# Patient Record
Sex: Female | Born: 1950 | Race: White | Hispanic: No | Smoking: Never smoker
Health system: Southern US, Community
[De-identification: ages and names within clinical notes are randomized; demographics above are authoritative.]

## PROBLEM LIST (undated history)

## (undated) DIAGNOSIS — D72819 Decreased white blood cell count, unspecified: Secondary | ICD-10-CM

## (undated) DIAGNOSIS — F411 Generalized anxiety disorder: Secondary | ICD-10-CM

## (undated) DIAGNOSIS — F429 Obsessive-compulsive disorder, unspecified: Secondary | ICD-10-CM

## (undated) DIAGNOSIS — D649 Anemia, unspecified: Secondary | ICD-10-CM

## (undated) DIAGNOSIS — F419 Anxiety disorder, unspecified: Secondary | ICD-10-CM

## (undated) DIAGNOSIS — R011 Cardiac murmur, unspecified: Secondary | ICD-10-CM

## (undated) DIAGNOSIS — F32A Depression, unspecified: Secondary | ICD-10-CM

## (undated) DIAGNOSIS — F329 Major depressive disorder, single episode, unspecified: Secondary | ICD-10-CM

## (undated) DIAGNOSIS — I10 Essential (primary) hypertension: Secondary | ICD-10-CM

## (undated) DIAGNOSIS — Z9989 Dependence on other enabling machines and devices: Secondary | ICD-10-CM

## (undated) DIAGNOSIS — K219 Gastro-esophageal reflux disease without esophagitis: Secondary | ICD-10-CM

## (undated) DIAGNOSIS — E785 Hyperlipidemia, unspecified: Secondary | ICD-10-CM

## (undated) DIAGNOSIS — G4733 Obstructive sleep apnea (adult) (pediatric): Secondary | ICD-10-CM

## (undated) DIAGNOSIS — F320 Major depressive disorder, single episode, mild: Secondary | ICD-10-CM

## (undated) HISTORY — DX: Decreased white blood cell count, unspecified: D72.819

## (undated) HISTORY — DX: Dependence on other enabling machines and devices: Z99.89

## (undated) HISTORY — PX: COLONOSCOPY: SHX174

## (undated) HISTORY — DX: Major depressive disorder, single episode, mild: F32.0

## (undated) HISTORY — DX: Major depressive disorder, single episode, unspecified: F32.9

## (undated) HISTORY — PX: BREAST CYST EXCISION: SHX579

## (undated) HISTORY — DX: Depression, unspecified: F32.A

## (undated) HISTORY — DX: Cardiac murmur, unspecified: R01.1

## (undated) HISTORY — DX: Obsessive-compulsive disorder, unspecified: F42.9

## (undated) HISTORY — PX: BREAST MASS EXCISION: SHX1267

## (undated) HISTORY — DX: Anxiety disorder, unspecified: F41.9

## (undated) HISTORY — DX: Gastro-esophageal reflux disease without esophagitis: K21.9

## (undated) HISTORY — DX: Obstructive sleep apnea (adult) (pediatric): G47.33

## (undated) HISTORY — DX: Generalized anxiety disorder: F41.1

## (undated) HISTORY — DX: Essential (primary) hypertension: I10

## (undated) HISTORY — DX: Anemia, unspecified: D64.9

## (undated) HISTORY — PX: BREAST SURGERY: SHX581

## (undated) HISTORY — DX: Hyperlipidemia, unspecified: E78.5

---

## 1999-07-04 ENCOUNTER — Other Ambulatory Visit: Admission: RE | Admit: 1999-07-04 | Discharge: 1999-07-04 | Payer: Self-pay | Admitting: Obstetrics and Gynecology

## 1999-07-31 ENCOUNTER — Encounter: Payer: Self-pay | Admitting: Obstetrics and Gynecology

## 1999-07-31 ENCOUNTER — Ambulatory Visit (HOSPITAL_COMMUNITY): Admission: RE | Admit: 1999-07-31 | Discharge: 1999-07-31 | Payer: Self-pay | Admitting: Obstetrics and Gynecology

## 1999-09-19 ENCOUNTER — Other Ambulatory Visit: Admission: RE | Admit: 1999-09-19 | Discharge: 1999-09-19 | Payer: Self-pay | Admitting: Obstetrics and Gynecology

## 1999-09-19 ENCOUNTER — Encounter (INDEPENDENT_AMBULATORY_CARE_PROVIDER_SITE_OTHER): Payer: Self-pay

## 1999-11-11 ENCOUNTER — Encounter (INDEPENDENT_AMBULATORY_CARE_PROVIDER_SITE_OTHER): Payer: Self-pay | Admitting: Specialist

## 1999-11-11 ENCOUNTER — Other Ambulatory Visit: Admission: RE | Admit: 1999-11-11 | Discharge: 1999-11-11 | Payer: Self-pay | Admitting: Obstetrics and Gynecology

## 2000-03-26 ENCOUNTER — Other Ambulatory Visit: Admission: RE | Admit: 2000-03-26 | Discharge: 2000-03-26 | Payer: Self-pay | Admitting: Obstetrics and Gynecology

## 2000-04-03 ENCOUNTER — Encounter: Admission: RE | Admit: 2000-04-03 | Discharge: 2000-04-03 | Payer: Self-pay | Admitting: Obstetrics and Gynecology

## 2000-04-03 ENCOUNTER — Encounter: Payer: Self-pay | Admitting: Obstetrics and Gynecology

## 2000-05-11 ENCOUNTER — Encounter (INDEPENDENT_AMBULATORY_CARE_PROVIDER_SITE_OTHER): Payer: Self-pay

## 2000-05-11 ENCOUNTER — Other Ambulatory Visit: Admission: RE | Admit: 2000-05-11 | Discharge: 2000-05-11 | Payer: Self-pay | Admitting: Obstetrics and Gynecology

## 2000-08-26 ENCOUNTER — Encounter: Admission: RE | Admit: 2000-08-26 | Discharge: 2000-08-26 | Payer: Self-pay | Admitting: General Surgery

## 2000-08-26 ENCOUNTER — Encounter: Payer: Self-pay | Admitting: General Surgery

## 2000-09-22 ENCOUNTER — Other Ambulatory Visit: Admission: RE | Admit: 2000-09-22 | Discharge: 2000-09-22 | Payer: Self-pay | Admitting: Obstetrics and Gynecology

## 2000-09-22 ENCOUNTER — Encounter (INDEPENDENT_AMBULATORY_CARE_PROVIDER_SITE_OTHER): Payer: Self-pay

## 2001-04-12 ENCOUNTER — Other Ambulatory Visit: Admission: RE | Admit: 2001-04-12 | Discharge: 2001-04-12 | Payer: Self-pay | Admitting: Obstetrics and Gynecology

## 2001-08-30 ENCOUNTER — Encounter: Payer: Self-pay | Admitting: Obstetrics and Gynecology

## 2001-08-30 ENCOUNTER — Ambulatory Visit (HOSPITAL_COMMUNITY): Admission: RE | Admit: 2001-08-30 | Discharge: 2001-08-30 | Payer: Self-pay | Admitting: Obstetrics and Gynecology

## 2001-11-04 ENCOUNTER — Other Ambulatory Visit: Admission: RE | Admit: 2001-11-04 | Discharge: 2001-11-04 | Payer: Self-pay | Admitting: Obstetrics and Gynecology

## 2002-08-31 ENCOUNTER — Encounter (INDEPENDENT_AMBULATORY_CARE_PROVIDER_SITE_OTHER): Payer: Self-pay

## 2002-08-31 ENCOUNTER — Ambulatory Visit (HOSPITAL_COMMUNITY): Admission: RE | Admit: 2002-08-31 | Discharge: 2002-08-31 | Payer: Self-pay | Admitting: Obstetrics and Gynecology

## 2002-09-15 ENCOUNTER — Encounter: Payer: Self-pay | Admitting: Obstetrics and Gynecology

## 2002-09-15 ENCOUNTER — Ambulatory Visit (HOSPITAL_COMMUNITY): Admission: RE | Admit: 2002-09-15 | Discharge: 2002-09-15 | Payer: Self-pay | Admitting: Obstetrics and Gynecology

## 2003-01-06 ENCOUNTER — Encounter (INDEPENDENT_AMBULATORY_CARE_PROVIDER_SITE_OTHER): Payer: Self-pay | Admitting: Specialist

## 2003-01-06 ENCOUNTER — Ambulatory Visit (HOSPITAL_COMMUNITY): Admission: RE | Admit: 2003-01-06 | Discharge: 2003-01-06 | Payer: Self-pay | Admitting: Gastroenterology

## 2003-11-06 ENCOUNTER — Encounter: Admission: RE | Admit: 2003-11-06 | Discharge: 2003-11-06 | Payer: Self-pay | Admitting: Obstetrics and Gynecology

## 2004-05-08 ENCOUNTER — Other Ambulatory Visit: Admission: RE | Admit: 2004-05-08 | Discharge: 2004-05-08 | Payer: Self-pay | Admitting: Obstetrics and Gynecology

## 2004-10-14 ENCOUNTER — Ambulatory Visit: Payer: Self-pay | Admitting: Family Medicine

## 2004-11-20 ENCOUNTER — Encounter: Admission: RE | Admit: 2004-11-20 | Discharge: 2004-11-20 | Payer: Self-pay | Admitting: Obstetrics and Gynecology

## 2004-11-26 ENCOUNTER — Other Ambulatory Visit: Admission: RE | Admit: 2004-11-26 | Discharge: 2004-11-26 | Payer: Self-pay | Admitting: Obstetrics and Gynecology

## 2004-12-04 ENCOUNTER — Encounter: Admission: RE | Admit: 2004-12-04 | Discharge: 2004-12-04 | Payer: Self-pay | Admitting: Obstetrics and Gynecology

## 2005-01-20 ENCOUNTER — Encounter: Admission: RE | Admit: 2005-01-20 | Discharge: 2005-01-20 | Payer: Self-pay | Admitting: General Surgery

## 2005-01-30 ENCOUNTER — Encounter: Admission: RE | Admit: 2005-01-30 | Discharge: 2005-01-30 | Payer: Self-pay | Admitting: Obstetrics and Gynecology

## 2005-03-06 ENCOUNTER — Encounter: Admission: RE | Admit: 2005-03-06 | Discharge: 2005-03-06 | Payer: Self-pay | Admitting: Family Medicine

## 2005-08-27 ENCOUNTER — Encounter: Admission: RE | Admit: 2005-08-27 | Discharge: 2005-08-27 | Payer: Self-pay | Admitting: Obstetrics and Gynecology

## 2005-08-29 ENCOUNTER — Encounter: Admission: RE | Admit: 2005-08-29 | Discharge: 2005-08-29 | Payer: Self-pay | Admitting: Gastroenterology

## 2005-11-28 ENCOUNTER — Other Ambulatory Visit: Admission: RE | Admit: 2005-11-28 | Discharge: 2005-11-28 | Payer: Self-pay | Admitting: Obstetrics and Gynecology

## 2006-01-06 ENCOUNTER — Encounter: Admission: RE | Admit: 2006-01-06 | Discharge: 2006-01-06 | Payer: Self-pay | Admitting: Obstetrics and Gynecology

## 2006-03-20 ENCOUNTER — Encounter: Payer: Self-pay | Admitting: General Practice

## 2006-04-03 ENCOUNTER — Encounter: Payer: Self-pay | Admitting: General Practice

## 2006-12-03 ENCOUNTER — Other Ambulatory Visit: Admission: RE | Admit: 2006-12-03 | Discharge: 2006-12-03 | Payer: Self-pay | Admitting: Obstetrics and Gynecology

## 2007-01-13 ENCOUNTER — Encounter: Admission: RE | Admit: 2007-01-13 | Discharge: 2007-01-13 | Payer: Self-pay | Admitting: Obstetrics and Gynecology

## 2007-03-21 ENCOUNTER — Ambulatory Visit: Payer: Self-pay | Admitting: Internal Medicine

## 2007-11-16 ENCOUNTER — Encounter: Admission: RE | Admit: 2007-11-16 | Discharge: 2007-11-16 | Payer: Self-pay | Admitting: Obstetrics and Gynecology

## 2007-12-30 ENCOUNTER — Other Ambulatory Visit: Admission: RE | Admit: 2007-12-30 | Discharge: 2007-12-30 | Payer: Self-pay | Admitting: Obstetrics and Gynecology

## 2008-01-18 ENCOUNTER — Encounter: Admission: RE | Admit: 2008-01-18 | Discharge: 2008-01-18 | Payer: Self-pay | Admitting: Obstetrics and Gynecology

## 2008-04-19 DIAGNOSIS — C4491 Basal cell carcinoma of skin, unspecified: Secondary | ICD-10-CM

## 2008-04-19 HISTORY — DX: Basal cell carcinoma of skin, unspecified: C44.91

## 2008-06-01 ENCOUNTER — Encounter (INDEPENDENT_AMBULATORY_CARE_PROVIDER_SITE_OTHER): Payer: Self-pay | Admitting: Surgery

## 2008-06-01 ENCOUNTER — Ambulatory Visit (HOSPITAL_BASED_OUTPATIENT_CLINIC_OR_DEPARTMENT_OTHER): Admission: RE | Admit: 2008-06-01 | Discharge: 2008-06-01 | Payer: Self-pay | Admitting: Surgery

## 2009-01-01 ENCOUNTER — Other Ambulatory Visit: Admission: RE | Admit: 2009-01-01 | Discharge: 2009-01-01 | Payer: Self-pay | Admitting: Obstetrics and Gynecology

## 2009-01-29 ENCOUNTER — Encounter: Admission: RE | Admit: 2009-01-29 | Discharge: 2009-01-29 | Payer: Self-pay | Admitting: Obstetrics and Gynecology

## 2010-01-01 ENCOUNTER — Other Ambulatory Visit: Admission: RE | Admit: 2010-01-01 | Discharge: 2010-01-01 | Payer: Self-pay | Admitting: Obstetrics and Gynecology

## 2010-01-30 ENCOUNTER — Encounter: Admission: RE | Admit: 2010-01-30 | Discharge: 2010-01-30 | Payer: Self-pay | Admitting: Obstetrics and Gynecology

## 2010-02-07 ENCOUNTER — Encounter: Admission: RE | Admit: 2010-02-07 | Discharge: 2010-02-07 | Payer: Self-pay | Admitting: Obstetrics and Gynecology

## 2010-04-17 ENCOUNTER — Ambulatory Visit: Payer: Self-pay | Admitting: Internal Medicine

## 2010-05-03 ENCOUNTER — Ambulatory Visit: Payer: Self-pay | Admitting: Internal Medicine

## 2010-06-03 ENCOUNTER — Ambulatory Visit: Payer: Self-pay | Admitting: Internal Medicine

## 2010-11-24 ENCOUNTER — Encounter: Payer: Self-pay | Admitting: Obstetrics and Gynecology

## 2011-02-06 ENCOUNTER — Ambulatory Visit: Payer: Self-pay | Admitting: Family Medicine

## 2011-03-18 NOTE — Op Note (Signed)
[name], Olivia Marsh               ACCOUNT NO.:  [id number]   MEDICAL RECORD NO.:  [id number]          PATIENT TYPE:  AMB   LOCATION:  DSC                          FACILITY:  [facility]   PHYSICIAN:  Currie Paris, M.D.DATE OF BIRTH:  12-12-1950   DATE OF PROCEDURE:  DATE OF DISCHARGE:                               OPERATIVE REPORT   CHIEF COMPLAINT:  Right breast mass.   PREOPERATIVE DIAGNOSIS:  Right breast mass.   POSTOPERATIVE DIAGNOSIS:  Right breast mass.   OPERATION:  Removal of right breast mass.   ANESTHESIA:  MAC.   CLINICAL HISTORY:  This is a 60 year old lady that has an area under the  right areolar that had somewhat of a sharp marginated edge to it.  We  had followed her about 3 months and it had really not changed but felt  different in other areas in the breast.  Imaging studies had been  unremarkable.  After discussion with the patient, we elected to remove  this.   DESCRIPTION OF PROCEDURE:  The patient was seen in the holding area and  she had no further questions.  We identified and marked the right breast  as the operative side.   The patient was taken into the operating room and prior to being given  IV sedation, the mass was palpated in the exact location and marked.  It  was about 6:30 or 7 o'clock position about halfway between the nipple  proper and the edge of the areola on the right breast.   The patient then had IV sedation.  The breast was prepped and draped.  The time-out was done.   I injected 1% Xylocaine with epi mixed equally with 0.5% plain Marcaine  in the skin and subcutaneous tissues of the areola.  I made a  curvilinear incision at the areolar margin and elevated the skin up.  I  could palpate the mass at least one edge of it as it seemed to be less  discrete laterally and more discrete at its medial edge.  Once I was  able to palpate it, I put a suture through it for some traction and  began using cautery to try to excise it.  In  doing so, I entered a  cystic cavity filled with a yellowish material that was fairly thick.  Upon doing so, the palpable abnormality disappeared.  I went ahead and  took that area out and we saw several what appeared to be dilated ducts  filled with thick material.  Whether this represented significant  fibrocystic ductal ectasia or DCIS was not discernible by gross  examination.   Once this was out, I made sure everything was dry using cautery.  I then  closed with 3-0 Vicryl, 4-0 Monocryl, and subcuticular Dermabond.   The patient tolerated the procedure well.  There were no complications.  All counts were correct.      Currie Paris, M.D.  Electronically Signed     CJS/MEDQ  D:  06/01/2008  T:  06/01/2008  Job:  [id number]   cc:   Julieanne Manson  Artist Pais,  M.D. 

## 2011-03-21 NOTE — Op Note (Signed)
   NAME:  Olivia Marsh, Olivia Marsh                         ACCOUNT NO.:  [id number]   MEDICAL RECORD NO.:  [id number]                   PATIENT TYPE:  AMB   LOCATION:  ENDO                                 FACILITY:  [facility]   PHYSICIAN:  James L. Malon Kindle., M.D.          DATE OF BIRTH:  1951/08/11   DATE OF PROCEDURE:  01/06/2003  DATE OF DISCHARGE:                                 OPERATIVE REPORT   PROCEDURE:  Colonoscopy and biopsy.   MEDICATIONS:  Fentanyl 100 mcg, Versed 10 mg IV.   SCOPE:  Olympus pediatric adjustable colonoscope.   INDICATION:  The patient with Hemoccult positive stools with a previous  history of colitis in the distant past.   DESCRIPTION OF PROCEDURE:  The procedure had been explained to the patient  and consent obtained.  The patient in the left lateral decubitus position,  the Olympus scope was inserted and advanced under direct visualization.  We  were easily able to advance over to the cecum.  The ileocecal valve was seen  and entered for approximately 5-10 cm.  The terminal ileum was normal.  The  scope withdrawn, and mucosa was carefully examined upon withdrawal.  No  polyps were seen throughout the entire colon.  Normal pattern throughout the  colon.  Multiple biopsies were taken.  The scope withdrawn in the rectum.  There were internal hemorrhoids in the rectum.  The scope was withdrawn.  The patient tolerated the procedure well.   ASSESSMENT:  1. Essentially normal mucosa with no evidence of active colitis.  Nothing to     explain the heme-positive stool.  2. Internal hemorrhoids.   PLAN:  We will check pathology and see back in the office in 2-3 months.                                               James L. Malon Kindle., M.D.    Waldron Session  D:  01/06/2003  T:  01/06/2003  Job:  [id number]   cc:   Artist Pais, M.D.  [address], [address]  [place]  [place] [address]  Fax: [phone]   Kizzie Furnish, M.D.  [address]  [place]  [place] [address]  Fax:  [phone]

## 2011-03-21 NOTE — Op Note (Signed)
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   PROCEDURE:  1. CO2 laser vaporization of condylomata.  2. Multiple vulvar biopsies.   SURGEON:  Artist Pais, MD   ANESTHESIA:  General endotracheal plus lidocaine subcutaneously for  biopsies.   COMPLICATIONS:  None.   FLUIDS:  850 cc of crystalloid.   ESTIMATED BLOOD LOSS:  Minimal.   DRAINS:  None.   FINDINGS:  Previous biopsy sites were noted to be healing well.  There was  noted to be a skin tag of condylomata at the left inner thigh region  adjacent to the groin.  In addition, the patient called my attention to a  skin tag or condylomata in the sacral region which was biopsied.  Additionally, posterior to the perirectal area were confluent dark lesions,  possible VIN, which were biopsied.  Additional condylomata were noted in  this area as well.   DESCRIPTION OF OPERATION:  The patient was brought to the operating room,  identified on the operating table.  After induction of adequate general  endotracheal anesthesia the patient was placed in the [place] stirrups and  draped with wet towels.  Ice pack was placed over the vulvar and perirectal  areas for five minutes.  Using the CO2 laser at the most focused beam at 10  watts the areas of condylomata were lasered such that they were vaporized.  All discreet areas were vaporized.  The areas of previous biopsies of the  right inner labia minora,  medial and lateral portions and the left inner  labia minora were lasered further as well.  The large condylomata of the  posterior fourchette was vaporized.  There was also noted to be some  confluent condylomata distal to that portion and I did biopsy this as this  was dark in appearance and could be VIN.  Initially, all of the vulvar  condylomata were lasered.  Subsequently, the area distal to the introitus  was biopsied and lasered in its entirety.  Attention was subsequently turned  to all the perirectal condylomata.  I did place a wet sponge into the rectum  to decrease the risk of gas eroding the laser beam and these areas were  lasered as well.  I did a very careful inspection to insure that I had  lasered all areas.  Subsequently, I  had noted on inspection and examination  that distal to the perirectal condylomata was a confluent area of  condylomatous appearing lesions which were dark in appearance and could be  possible severe dysplasia.  This was biopsied.  An adjacent condylomata on  the right was also lasered.  All of the condylomata distal to the perirectal  area were lasered as well.  I did send a biopsy of a skin tag that was at  the left inner thigh as well.  This was removed in its entirety.  Subsequently, after all these areas were lysed, I placed Silvadene cream  over the areas.  Again, very careful inspection revealed all the condylomata  had been vaporized.  The patient was subsequently placed on her right side  and I was able to identify the sacral area which the patient thinks could  possibly be a condylomata and I removed this in its entirety and placed  Silvadene cream.  At that point the procedure was then terminated.  The  patient tolerated the procedure well with no apparent complications and was  transferred to the recovery room in stable condition after all instrument,  sponge, and needle counts were correct.  When the patient came for her  preoperative visit  she was given prescription for Instant Ocean.  She is to  place one cup of this in tepid bath water and soak three to four times  daily.  Afterwards, she was instructed to dry off with the cool setting of  the hair dryer and place Silvadene cream.  She is urged to eat plenty of  fiber and drink fluids to facilitate bowel movements, although these were  superficial.  She was given Tylox one to two p.o. q.3-4h. p.r.n. pain.  She  was to return in two weeks for a postoperative examination, to place nothing  in her vagina, but to call if she has any problems.   PROCEDURE:  CO2 laser vaporization of multifocal condylomata and multiple  biopsies.                                               Artist Pais, MD    DC/MEDQ  D:  08/31/2002  T:  08/31/2002  Job:  [id number]

## 2011-03-21 NOTE — H&P (Signed)
NAME:  Olivia Marsh, CAPASSO                         ACCOUNT NO.:  [id number]   MEDICAL RECORD NO.:  [id number]                   PATIENT TYPE:  OUT   LOCATION:  MAMO                                 FACILITY:  [facility]   PHYSICIAN:  Artist Pais, MD                   DATE OF BIRTH:  07/08/1951   DATE OF ADMISSION:  08/31/2002  DATE OF DISCHARGE:                                HISTORY & PHYSICAL   HISTORY OF PRESENT ILLNESS:  The patient is a 60 year old Caucasian female  para 2 who presented on August 16, 2002 complaining of increased rectal  pain after being treated with Analpram for a hemorrhoid.  She also had noted  numerous growths in her perirectal area.  Examination revealed there to be  multifocal condylomata in the perirectal area as well as over the vulva and  the vaginal introitus.  Biopsies were taken and found to be condyloma  associated with slight squamous dysplasia.  Because of the multifocal nature  of the condylomata and the fact that these are very uncomfortable, the  patient is admitted for laser therapy for her condylomata.  She knows that I  will not address her small hemorrhoidal tag and is amenable with this.  Risks of surgery including anesthetic complications, hemorrhage, infection,  damage to adjacent structures including bladder, bowel, blood vessels, or  ureters discussed with the patient.  She was also made aware of the risk of  retinal damage from the laser.  She expresses understanding of and  acceptance of these risks.  In addition, even though the other biopsies have  shown a slight dysplasia, I may elect to perform other biopsies and she has  given permission for this as well.   PAST MEDICAL HISTORY:  1. History of hemorrhoids.  2. Hypertension.  3. Depression.  4. Menorrhagia well controlled on oral contraceptives.  5. Dysplasia treated with LEEP.  The patient has had condylomata in the past     which have been treated.   ALLERGIES:  No known drug  allergies.   CURRENT MEDICATIONS:  1. Prinivil 5 mg daily.  2. Celexa 20 mg daily.  3. Lo-Estrin 1.5/30.  4. Qdall one daily.   PAST SURGICAL HISTORY:  Tubal ligation.   FAMILY HISTORY:  There is no family history of ovarian or prostate cancer.  The patient's mother is 90 with hypertension.  Father is 8 with heart  disease and hypertension.  One sister age 15 with rheumatoid arthritis.  Maternal grandmother with postmenopausal breast cancer in her 36s and a  distant relative with diagnosis of colon cancer.   SOCIAL HISTORY:  The patient is an Print production planner for [employer].  Tobacco:  None.  Alcohol:  Occasional.   REVIEW OF SYSTEMS:  Noncontributory except as noted above.  Denies headache,  visual changes, chest pain, shortness of breath, abdominal pain, change in  bowel habits,  unintentional weight loss, dysuria, urgency, frequency,  vaginal pruritus or discharge, pain or bleeding with intercourse.   PHYSICAL EXAMINATION:  GENERAL:  Well-developed Caucasian female.  VITAL SIGNS:  Blood pressure 130/80, heart rate 80, weight 153, height 63  inches.  HEENT:  Normal.  NECK:  Supple without thyromegaly, adenopathy, or nodules.  CHEST:  Clear to auscultation.  BREASTS:  No masses, no retraction or nipple discharge.  CARDIAC:  Regular rate and rhythm without extra sounds or murmurs.  ABDOMEN:  Soft, nontender.  No hepatosplenomegaly or masses.  PELVIC:  Structurally normal external female genitalia.  However, there are  multiple condylomata over the left inner labia minora, left inner labia  majora, at the vaginal introitus, at the right inner labia minora, and at  the right labia minora and majora.  There are also multiple perirectal  condylomata as well.  There are no vaginal or cervical lesions.  Recent Pap  smear performed November 04, 2001 within normal limits.  Bimanual examination  reveals the uterus to be normal, mobile, nontender without any adnexal mass   palpated.  RECTAL:  Excellent sphincter tone.  Confirms pelvic examination.  No masses  palpated.   IMPRESSION AND PLAN:  The patient is a 60 year old gravida 2, para 2  Caucasian female with multifocal condylomata and slight dysplasia admitted  for laser treatment of the multifocal condylomata as well as possible  biopsy.  All risks have been explained to her.  She expresses understanding  of and acceptance of these risks.  I have given her a prescription for Tylox  dispensed 30 one to two p.o. q.4-6h. p.r.n. pain for postoperative pain and  also explained that she will need to soak in instant ocean check-up in the  bath water two to three times daily and at surgery I will also give her some  Silvadene cream.                                               Artist Pais, MD    DC/MEDQ  D:  08/30/2002  T:  08/30/2002  Job:  [id number]

## 2011-08-01 LAB — POCT HEMOGLOBIN-HEMACUE: Hemoglobin: 13.8

## 2011-10-06 ENCOUNTER — Ambulatory Visit: Payer: Self-pay

## 2011-12-17 ENCOUNTER — Encounter (INDEPENDENT_AMBULATORY_CARE_PROVIDER_SITE_OTHER): Payer: 59 | Admitting: Ophthalmology

## 2011-12-17 DIAGNOSIS — H43819 Vitreous degeneration, unspecified eye: Secondary | ICD-10-CM

## 2011-12-17 DIAGNOSIS — H2510 Age-related nuclear cataract, unspecified eye: Secondary | ICD-10-CM

## 2011-12-17 DIAGNOSIS — H35379 Puckering of macula, unspecified eye: Secondary | ICD-10-CM

## 2011-12-28 ENCOUNTER — Ambulatory Visit: Payer: Self-pay | Admitting: Neurology

## 2012-01-16 ENCOUNTER — Ambulatory Visit: Payer: Self-pay | Admitting: Neurology

## 2012-05-25 ENCOUNTER — Other Ambulatory Visit: Payer: Self-pay | Admitting: Nurse Practitioner

## 2012-05-25 ENCOUNTER — Other Ambulatory Visit (HOSPITAL_COMMUNITY)
Admission: RE | Admit: 2012-05-25 | Discharge: 2012-05-25 | Disposition: A | Discharge status: Home / Self Care | Payer: 59 | Source: Ambulatory Visit | Attending: Obstetrics and Gynecology | Admitting: Obstetrics and Gynecology

## 2012-05-25 DIAGNOSIS — Z01419 Encounter for gynecological examination (general) (routine) without abnormal findings: Secondary | ICD-10-CM | POA: Insufficient documentation

## 2012-12-17 ENCOUNTER — Ambulatory Visit (INDEPENDENT_AMBULATORY_CARE_PROVIDER_SITE_OTHER): Payer: 59 | Admitting: Ophthalmology

## 2012-12-24 ENCOUNTER — Ambulatory Visit (INDEPENDENT_AMBULATORY_CARE_PROVIDER_SITE_OTHER): Payer: 59 | Admitting: Ophthalmology

## 2012-12-24 DIAGNOSIS — H43819 Vitreous degeneration, unspecified eye: Secondary | ICD-10-CM

## 2012-12-24 DIAGNOSIS — H2510 Age-related nuclear cataract, unspecified eye: Secondary | ICD-10-CM

## 2012-12-24 DIAGNOSIS — H35379 Puckering of macula, unspecified eye: Secondary | ICD-10-CM

## 2013-03-10 ENCOUNTER — Encounter: Payer: Self-pay | Admitting: *Deleted

## 2013-03-23 ENCOUNTER — Ambulatory Visit: Payer: 59 | Admitting: Cardiovascular Disease

## 2013-04-08 ENCOUNTER — Encounter: Payer: Self-pay | Admitting: Cardiovascular Disease

## 2013-04-08 ENCOUNTER — Ambulatory Visit (INDEPENDENT_AMBULATORY_CARE_PROVIDER_SITE_OTHER): Payer: 59 | Admitting: Cardiovascular Disease

## 2013-04-08 VITALS — BP 140/90 | HR 76 | Ht 64.5 in | Wt 141.0 lb

## 2013-04-08 DIAGNOSIS — R55 Syncope and collapse: Secondary | ICD-10-CM | POA: Insufficient documentation

## 2013-04-08 NOTE — Patient Instructions (Addendum)
You are doing well. No medication changes were made.  We will order an echocardiogram and 48 hour holter monitor for recent syncope We will call you with the results  Please call us if you have new issues that need to be addressed before your next appt.
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
## 2013-04-19 ENCOUNTER — Other Ambulatory Visit: Payer: 59

## 2013-04-26 ENCOUNTER — Other Ambulatory Visit: Payer: 59

## 2013-04-26 ENCOUNTER — Other Ambulatory Visit (INDEPENDENT_AMBULATORY_CARE_PROVIDER_SITE_OTHER): Payer: 59

## 2013-04-26 ENCOUNTER — Other Ambulatory Visit: Payer: Self-pay

## 2013-04-26 DIAGNOSIS — R55 Syncope and collapse: Secondary | ICD-10-CM

## 2013-05-09 ENCOUNTER — Telehealth: Payer: Self-pay

## 2013-05-09 ENCOUNTER — Encounter (INDEPENDENT_AMBULATORY_CARE_PROVIDER_SITE_OTHER): Payer: 59

## 2013-05-09 DIAGNOSIS — R55 Syncope and collapse: Secondary | ICD-10-CM

## 2013-05-09 NOTE — Telephone Encounter (Signed)
Notified patient per Dr. Mariah Milling 48 hour holter monitor showed NSR with rare APC & PVC's. The patient had an Echo and told her someone would call her with the results.

## 2013-12-07 ENCOUNTER — Emergency Department: Payer: Self-pay | Admitting: Emergency Medicine

## 2013-12-07 LAB — URINALYSIS, COMPLETE
BILIRUBIN, UR: NEGATIVE
BLOOD: NEGATIVE
Glucose,UR: NEGATIVE mg/dL (ref 0–75)
Leukocyte Esterase: NEGATIVE
NITRITE: NEGATIVE
PROTEIN: NEGATIVE
Ph: 7 (ref 4.5–8.0)
RBC,UR: 1 /HPF (ref 0–5)
Specific Gravity: 1.015 (ref 1.003–1.030)
Squamous Epithelial: 1
WBC UR: 6 /HPF (ref 0–5)

## 2013-12-07 LAB — CBC WITH DIFFERENTIAL/PLATELET
BASOS ABS: 0 10*3/uL (ref 0.0–0.1)
BASOS PCT: 0.5 %
Eosinophil #: 0 10*3/uL (ref 0.0–0.7)
Eosinophil %: 0.3 %
HCT: 40.9 % (ref 35.0–47.0)
HGB: 13.9 g/dL (ref 12.0–16.0)
LYMPHS ABS: 1.4 10*3/uL (ref 1.0–3.6)
LYMPHS PCT: 17.6 %
MCH: 30.6 pg (ref 26.0–34.0)
MCHC: 34 g/dL (ref 32.0–36.0)
MCV: 90 fL (ref 80–100)
Monocyte #: 0.4 x10 3/mm (ref 0.2–0.9)
Monocyte %: 5.6 %
Neutrophil #: 6.1 10*3/uL (ref 1.4–6.5)
Neutrophil %: 76 %
Platelet: 203 10*3/uL (ref 150–440)
RBC: 4.56 10*6/uL (ref 3.80–5.20)
RDW: 12.9 % (ref 11.5–14.5)
WBC: 8 10*3/uL (ref 3.6–11.0)

## 2013-12-07 LAB — BASIC METABOLIC PANEL
ANION GAP: 5 — AB (ref 7–16)
BUN: 16 mg/dL (ref 7–18)
CO2: 28 mmol/L (ref 21–32)
CREATININE: 0.64 mg/dL (ref 0.60–1.30)
Calcium, Total: 9.1 mg/dL (ref 8.5–10.1)
Chloride: 104 mmol/L (ref 98–107)
EGFR (Non-African Amer.): >60
Glucose: 117 mg/dL — ABNORMAL HIGH (ref 65–99)
Osmolality: 276 (ref 275–301)
Potassium: 3.1 mmol/L — ABNORMAL LOW (ref 3.5–5.1)
Sodium: 137 mmol/L (ref 136–145)

## 2013-12-07 LAB — TROPONIN I: Troponin-I: <0.02 ng/mL

## 2013-12-26 ENCOUNTER — Ambulatory Visit (INDEPENDENT_AMBULATORY_CARE_PROVIDER_SITE_OTHER): Payer: 59 | Admitting: Ophthalmology

## 2013-12-26 DIAGNOSIS — H35379 Puckering of macula, unspecified eye: Secondary | ICD-10-CM

## 2013-12-26 DIAGNOSIS — H2510 Age-related nuclear cataract, unspecified eye: Secondary | ICD-10-CM

## 2013-12-26 DIAGNOSIS — H43819 Vitreous degeneration, unspecified eye: Secondary | ICD-10-CM

## 2014-01-11 ENCOUNTER — Encounter: Payer: Self-pay | Admitting: Family Medicine

## 2014-01-11 ENCOUNTER — Ambulatory Visit (INDEPENDENT_AMBULATORY_CARE_PROVIDER_SITE_OTHER): Payer: 59 | Admitting: Family Medicine

## 2014-01-11 VITALS — BP 134/80 | HR 83 | Temp 98.1°F | Resp 16 | Ht 63.0 in | Wt 147.8 lb

## 2014-01-11 DIAGNOSIS — E876 Hypokalemia: Secondary | ICD-10-CM

## 2014-01-11 DIAGNOSIS — F329 Major depressive disorder, single episode, unspecified: Secondary | ICD-10-CM

## 2014-01-11 DIAGNOSIS — G4733 Obstructive sleep apnea (adult) (pediatric): Secondary | ICD-10-CM

## 2014-01-11 DIAGNOSIS — F3289 Other specified depressive episodes: Secondary | ICD-10-CM

## 2014-01-11 DIAGNOSIS — R55 Syncope and collapse: Secondary | ICD-10-CM

## 2014-01-11 LAB — COMPLETE METABOLIC PANEL WITH GFR
ALK PHOS: 46 U/L (ref 39–117)
ALT: 25 U/L (ref 0–35)
AST: 22 U/L (ref 0–37)
Albumin: 4.2 g/dL (ref 3.5–5.2)
BILIRUBIN TOTAL: 0.3 mg/dL (ref 0.2–1.2)
BUN: 14 mg/dL (ref 6–23)
CO2: 27 meq/L (ref 19–32)
Calcium: 9.7 mg/dL (ref 8.4–10.5)
Chloride: 103 mEq/L (ref 96–112)
Creat: 0.56 mg/dL (ref 0.50–1.10)
GFR, Est African American: >89 mL/min
GFR, Est Non African American: >89 mL/min
Glucose, Bld: 98 mg/dL (ref 70–99)
Potassium: 4.5 mEq/L (ref 3.5–5.3)
Sodium: 140 mEq/L (ref 135–145)
TOTAL PROTEIN: 6.7 g/dL (ref 6.0–8.3)

## 2014-01-11 LAB — CBC
HEMATOCRIT: 38.1 % (ref 36.0–46.0)
HEMOGLOBIN: 12.8 g/dL (ref 12.0–15.0)
MCH: 29.6 pg (ref 26.0–34.0)
MCHC: 33.6 g/dL (ref 30.0–36.0)
MCV: 88.2 fL (ref 78.0–100.0)
Platelets: 258 10*3/uL (ref 150–400)
RBC: 4.32 MIL/uL (ref 3.87–5.11)
RDW: 13.3 % (ref 11.5–15.5)
WBC: 4.5 10*3/uL (ref 4.0–10.5)

## 2014-01-11 LAB — POCT URINALYSIS DIPSTICK
BILIRUBIN UA: NEGATIVE
Blood, UA: NEGATIVE
GLUCOSE UA: NEGATIVE
KETONES UA: NEGATIVE
Nitrite, UA: NEGATIVE
PROTEIN UA: NEGATIVE
Spec Grav, UA: 1.015
Urobilinogen, UA: 0.2
pH, UA: 7

## 2014-01-11 MED ORDER — BUPROPION HCL ER (XL) 150 MG PO TB24: 150.0000 mg | ORAL_TABLET | Freq: Every day | ORAL | Status: DC
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
## 2014-01-12 LAB — TSH: TSH: 2.35 u[IU]/mL (ref 0.350–4.500)

## 2014-01-12 NOTE — Addendum Note (Signed)
Addended by: [name] on: 01/12/2014 10:54 AM   Modules accepted: Orders

## 2014-01-18 ENCOUNTER — Telehealth: Payer: Self-pay

## 2014-01-18 NOTE — Telephone Encounter (Signed)
See labs 

## 2014-01-18 NOTE — Telephone Encounter (Signed)
PT STATES SHE RECEIVED A CALL ABOUT HER LAB RESULTS. [place] [phone]

## 2014-01-27 ENCOUNTER — Ambulatory Visit (INDEPENDENT_AMBULATORY_CARE_PROVIDER_SITE_OTHER): Payer: 59 | Admitting: Cardiovascular Disease

## 2014-01-27 ENCOUNTER — Encounter: Payer: Self-pay | Admitting: Cardiovascular Disease

## 2014-01-27 VITALS — BP 130/88 | HR 80 | Ht 63.0 in | Wt 150.0 lb

## 2014-01-27 DIAGNOSIS — R011 Cardiac murmur, unspecified: Secondary | ICD-10-CM

## 2014-01-27 DIAGNOSIS — R61 Generalized hyperhidrosis: Secondary | ICD-10-CM | POA: Insufficient documentation

## 2014-01-27 DIAGNOSIS — R55 Syncope and collapse: Secondary | ICD-10-CM

## 2014-01-27 NOTE — Assessment & Plan Note (Signed)
No recent episodes of syncope. Last episode 10 months ago. No indication of arrhythmia recently apart from episode on 38/33/3832 of uncertain etiology

## 2014-01-27 NOTE — Assessment & Plan Note (Signed)
Episode of diaphoresis in early February of uncertain etiology. Note evidence of arrhythmia. Clinical exam is essentially benign. No symptoms concerning for arrhythmia over the past 10 months. We did talk about a 30 day monitor. She's not particularly interested at this time as she has been feeling well. No blood pressure  medications that could be contributing to hypotensive episodes. Situation not consistent with vasovagal episode. She denies symptoms concerning for gastroenteritis or other infection.   After long discussion and review of her emergency room records and prior history, we have suggested we monitor her for further symptoms with no further testing at this time. We have recommended she has diaphoresis, near syncope or syncope, that we perform a 30 day monitor .
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
## 2014-01-27 NOTE — Patient Instructions (Signed)
You are doing well. Please cut the potassium in 1/2 daily, except none on days with a banana  App store: Search "pulse meter" Instant heart rate, cardiograph  Please call us if you have new issues that need to be addressed before your next appt.

## 2014-02-22 DIAGNOSIS — K219 Gastro-esophageal reflux disease without esophagitis: Secondary | ICD-10-CM | POA: Insufficient documentation

## 2014-02-22 DIAGNOSIS — F428 Other obsessive-compulsive disorder: Secondary | ICD-10-CM | POA: Insufficient documentation

## 2014-02-22 DIAGNOSIS — D72819 Decreased white blood cell count, unspecified: Secondary | ICD-10-CM | POA: Insufficient documentation

## 2014-03-13 ENCOUNTER — Other Ambulatory Visit (INDEPENDENT_AMBULATORY_CARE_PROVIDER_SITE_OTHER): Payer: 59 | Admitting: Radiology

## 2014-03-13 DIAGNOSIS — Z131 Encounter for screening for diabetes mellitus: Secondary | ICD-10-CM

## 2014-03-13 DIAGNOSIS — Z0000 Encounter for general adult medical examination without abnormal findings: Secondary | ICD-10-CM

## 2014-03-13 DIAGNOSIS — Z13220 Encounter for screening for lipoid disorders: Secondary | ICD-10-CM

## 2014-03-13 LAB — COMPLETE METABOLIC PANEL WITH GFR
ALT: 23 U/L (ref 0–35)
AST: 21 U/L (ref 0–37)
Albumin: 4.3 g/dL (ref 3.5–5.2)
Alkaline Phosphatase: 46 U/L (ref 39–117)
BUN: 14 mg/dL (ref 6–23)
CO2: 29 meq/L (ref 19–32)
CREATININE: 0.67 mg/dL (ref 0.50–1.10)
Calcium: 9.1 mg/dL (ref 8.4–10.5)
Chloride: 106 mEq/L (ref 96–112)
GLUCOSE: 86 mg/dL (ref 70–99)
Potassium: 4.4 mEq/L (ref 3.5–5.3)
Sodium: 141 mEq/L (ref 135–145)
Total Bilirubin: 0.4 mg/dL (ref 0.2–1.2)
Total Protein: 6.7 g/dL (ref 6.0–8.3)

## 2014-03-13 LAB — HEMOGLOBIN A1C
HEMOGLOBIN A1C: 6 % — AB (ref <5.7)
Mean Plasma Glucose: 126 mg/dL — ABNORMAL HIGH (ref <117)

## 2014-03-13 LAB — LIPID PANEL
Cholesterol: 221 mg/dL — ABNORMAL HIGH (ref 0–200)
HDL: 78 mg/dL (ref >39)
LDL Cholesterol: 131 mg/dL — ABNORMAL HIGH (ref 0–99)
TRIGLYCERIDES: 58 mg/dL (ref <150)
Total CHOL/HDL Ratio: 2.8 Ratio
VLDL: 12 mg/dL (ref 0–40)

## 2014-03-13 LAB — VITAMIN B12: VITAMIN B 12: 835 pg/mL (ref 211–911)

## 2014-03-14 LAB — VITAMIN D 25 HYDROXY (VIT D DEFICIENCY, FRACTURES): VIT D 25 HYDROXY: 52 ng/mL (ref 30–89)

## 2014-03-15 ENCOUNTER — Ambulatory Visit (INDEPENDENT_AMBULATORY_CARE_PROVIDER_SITE_OTHER): Payer: 59 | Admitting: Family Medicine

## 2014-03-15 ENCOUNTER — Encounter: Payer: Self-pay | Admitting: Family Medicine

## 2014-03-15 VITALS — BP 132/82 | HR 74 | Temp 98.2°F | Resp 16

## 2014-03-15 DIAGNOSIS — Z01419 Encounter for gynecological examination (general) (routine) without abnormal findings: Secondary | ICD-10-CM

## 2014-03-15 DIAGNOSIS — F329 Major depressive disorder, single episode, unspecified: Secondary | ICD-10-CM

## 2014-03-15 DIAGNOSIS — R7309 Other abnormal glucose: Secondary | ICD-10-CM

## 2014-03-15 DIAGNOSIS — Z0000 Encounter for general adult medical examination without abnormal findings: Secondary | ICD-10-CM

## 2014-03-15 DIAGNOSIS — F32A Depression, unspecified: Secondary | ICD-10-CM

## 2014-03-15 DIAGNOSIS — E7800 Pure hypercholesterolemia, unspecified: Secondary | ICD-10-CM | POA: Insufficient documentation

## 2014-03-15 DIAGNOSIS — F419 Anxiety disorder, unspecified: Secondary | ICD-10-CM

## 2014-03-15 DIAGNOSIS — Z23 Encounter for immunization: Secondary | ICD-10-CM

## 2014-03-15 MED ORDER — ZOSTER VACCINE LIVE 19400 UNT/0.65ML ~~LOC~~ SOLR: 0.6500 mL | Freq: Once | SUBCUTANEOUS | Status: DC

## 2014-03-15 NOTE — Progress Notes (Signed)
This chart was scribed for [name], MD by Allena Earing, ED Scribe. This patient was seen in room 22 and the patient's care was started at 2:10 PM .  Subjective:    Patient ID: Olivia Marsh, female    DOB: 1951-01-16, 63 y.o.   MRN: [id number]  HPI   HPI Comments: BREE HEINZELMAN is a 63 y.o. female who presents to the [facility] for a physical. She reports that her hearing is good. She denies HA, visual disturbance, tinnitus, and sores in her mouth.  Her mother is still alive at 33, her father died at age of 44. She reports that she has had alcohol in her life and has not smoked any cigarettes.   Pt had a near syncopal event in March and was referred to a cardiologist, who advised her just to observe her symptoms. He also told her to take 1 potassium pill a day instead of eating a banana every morning.  Her last physical was 2013 by Dr. Theda Sers. Last pap smear was 2013 and her last mammogram was in 2013. She also had a colonoscopy in 2013. Her tetanus was reported as being UTD 2014, but she cannot remember having the shot. She wants a tetanus shot today as she would like to be safe. She has not had the shingles vaccine yet, she is interested in having one. Her eye doctor is Dr. Education officer, community in [place]. Her coloscopy is schedule for every 5 years.  Pt has a loaded gun that she leaves on her night stand, but locks it up when company comes.  She has another sleep study scheduled.   Past Medical History  Diagnosis Date  . Mild depression   . OCD (obsessive compulsive disorder)   . Anemia   . GAD (generalized anxiety disorder)   . GERD (gastroesophageal reflux disease)   . Leukopenia   . OSA on CPAP   . MDD (major depressive disorder)   . Heart murmur     history of  . Anxiety   . Hypertension    Current Outpatient Prescriptions on File Prior to Visit  Medication Sig Dispense Refill  . Ascorbic Acid (VITAMIN C PO) Take by mouth daily.    . Calcium  Carbonate-Vitamin D (CALCIUM 600+D3 PO) Take by mouth daily.    . Cholecalciferol (VITAMIN D3) 1000 UNITS CAPS Take by mouth daily.    . fluticasone (FLONASE) 50 MCG/ACT nasal spray Place 2 sprays into the nose daily.    . Multiple Vitamins-Minerals (CENTRUM SILVER ULTRA WOMENS) TABS Take by mouth daily.    Marland Kitchen OVER THE COUNTER MEDICATION daily.    . Potassium Chloride ER 20 MEQ TBCR Take 20 mEq by mouth daily.     . Venlafaxine HCl (EFFEXOR XR PO) Take 225 mg by mouth daily.     No current facility-administered medications on file prior to visit.     No Known Allergies    Review of Systems  Constitutional: Negative for fever, chills, diaphoresis, activity change, appetite change, fatigue and unexpected weight change.  HENT: Negative for congestion, dental problem, drooling, ear discharge, ear pain, facial swelling, hearing loss, mouth sores, nosebleeds, postnasal drip, rhinorrhea, sinus pressure, sneezing, sore throat, tinnitus, trouble swallowing and voice change.   Eyes: Negative for photophobia, pain, discharge, redness, itching and visual disturbance.  Respiratory: Negative for apnea, cough, choking, chest tightness, shortness of breath, wheezing and stridor.   Cardiovascular: Negative for chest pain, palpitations and leg swelling.  Gastrointestinal:  Negative for nausea, vomiting, abdominal pain, diarrhea, constipation, blood in stool, abdominal distention, anal bleeding and rectal pain.  Endocrine: Negative for cold intolerance, heat intolerance, polydipsia, polyphagia and polyuria.  Genitourinary: Negative for dysuria, urgency, frequency, hematuria, flank pain, decreased urine volume, vaginal bleeding, vaginal discharge, enuresis, difficulty urinating, genital sores, vaginal pain, menstrual problem, pelvic pain and dyspareunia.  Musculoskeletal: Negative for myalgias, back pain, joint swelling, arthralgias, gait problem, neck pain and neck stiffness.  Skin: Negative for color change,  pallor, rash and wound.  Allergic/Immunologic: Negative for environmental allergies, food allergies and immunocompromised state.  Neurological: Negative for dizziness, tremors, seizures, syncope, facial asymmetry, speech difficulty, weakness, light-headedness, numbness and headaches.  Hematological: Negative for adenopathy. Does not bruise/bleed easily.  Psychiatric/Behavioral: Negative for suicidal ideas, hallucinations, behavioral problems, confusion, sleep disturbance, self-injury, dysphoric mood, decreased concentration and agitation. The patient is not nervous/anxious and is not hyperactive.        Objective:   Physical Exam  Constitutional: She is oriented to person, place, and time. She appears well-developed and well-nourished. No distress.  HENT:  Head: Normocephalic and atraumatic.  Right Ear: External ear normal.  Left Ear: External ear normal.  Nose: Nose normal.  Mouth/Throat: Oropharynx is clear and moist.  Eyes: Conjunctivae and EOM are normal. Pupils are equal, round, and reactive to light. No scleral icterus.  Neck: Normal range of motion and full passive range of motion without pain. Neck supple. No JVD present. Carotid bruit is not present. No thyromegaly present.  Cardiovascular: Normal rate, regular rhythm and normal heart sounds.  Exam reveals no gallop and no friction rub.   No murmur heard. Pulmonary/Chest: Effort normal and breath sounds normal. No stridor. She has no wheezes. She has no rales. She exhibits no tenderness. Right breast exhibits no inverted nipple, no mass, no nipple discharge, no skin change and no tenderness. Left breast exhibits no inverted nipple, no mass, no nipple discharge, no skin change and no tenderness. Breasts are symmetrical.  Abdominal: Soft. Bowel sounds are normal. She exhibits no distension and no mass. There is no tenderness. There is no rebound and no guarding.  Genitourinary: Vagina normal and uterus normal. There is no rash,  tenderness or lesion on the right labia. There is no rash, tenderness or lesion on the left labia. Cervix exhibits no motion tenderness, no discharge and no friability. Right adnexum displays no mass, no tenderness and no fullness. Left adnexum displays no mass, no tenderness and no fullness.  Musculoskeletal: Normal range of motion. She exhibits no edema.       Right shoulder: Normal.       Left shoulder: Normal.       Cervical back: Normal.  Lymphadenopathy:    She has no cervical adenopathy.  Neurological: She is alert and oriented to person, place, and time. She has normal reflexes. No cranial nerve deficit. She exhibits normal muscle tone. Coordination normal.  Skin: Skin is warm and dry. No rash noted. She is not diaphoretic. No erythema. No pallor.  Psychiatric: She has a normal mood and affect. Her behavior is normal. Judgment and thought content normal.  Nursing note and vitals reviewed.  Filed Vitals:   03/15/14 1345  BP: 132/82  Pulse: 74  Temp: 98.2 F (36.8 C)  Resp: 16   TDAP ADMINISTERED.    Assessment & Plan:  Routine general medical examination at a health care facility  Routine gynecological examination - Plan: Pap IG and HPV (high risk) DNA detection, CANCELED: MM DIGITAL SCREENING  BILATERAL  Pure hypercholesterolemia  Other abnormal glucose  Anxiety and depression  Need for Zostavax administration - Plan: zoster vaccine live, PF, (ZOSTAVAX) 62563 UNT/0.65ML injection  Need for prophylactic vaccination with combined diphtheria-tetanus-pertussis (DTP) vaccine - Plan: Tdap vaccine greater than or equal to 7yo IM   1. Complete Physical Examination: anticipatory guidance --- weight maintenance, exercise. Pap smear obtained; refer for mammogram. Colonoscopy UTD.  S/p TDAP; rx for Zostavax provided. 2. Gynecological exam: pap smear obtained; refer for mammogram; menopausal. 3.  Hypercholesterolemia: obtain labs. 4.  Glucose Intolerance: stable; obtain labs;  recommend dietary modification. 5.  Anxiety and depression: stable; no change to management. 6.  S/p TDAP 7. Rx for Zostavax provided.    Meds ordered this encounter  Medications  . zoster vaccine live, PF, (ZOSTAVAX) 89373 UNT/0.65ML injection    Sig: Inject 19,400 Units into the skin once.    Dispense:  0.65 mL    Refill:  0     I personally performed the services described in this documentation, which was scribed in my presence. The recorded information has been reviewed and is accurate.  Reginia Forts, M.D.  [facility] [address] [place], [place]  [address] [phone] phone [phone] fax

## 2014-03-15 NOTE — Patient Instructions (Signed)
1. Call insurance regarding coverage of shingles vaccine (Zostavax). Also ask insurance if you should get shingles vaccine at [facility] office or pharmacy.

## 2014-03-15 NOTE — Progress Notes (Signed)
   Subjective:    Patient ID: Olivia Marsh, female    DOB: 1951/08/14, 63 y.o.   MRN: [id number]  HPI    Review of Systems  Constitutional: Negative.   HENT: Negative.   Eyes: Negative.   Respiratory: Negative.   Cardiovascular: Negative.   Gastrointestinal: Negative.   Endocrine: Negative.   Genitourinary: Negative.   Musculoskeletal: Negative.   Skin: Negative.   Allergic/Immunologic: Negative.   Neurological: Negative.   Hematological: Negative.   Psychiatric/Behavioral: Positive for sleep disturbance and decreased concentration.       Objective:   Physical Exam        Assessment & Plan:

## 2014-03-16 LAB — PAP IG AND HPV HIGH-RISK: HPV DNA High Risk: NOT DETECTED

## 2014-03-23 ENCOUNTER — Encounter: Payer: Self-pay | Admitting: Family Medicine

## 2014-03-30 ENCOUNTER — Ambulatory Visit
Admission: RE | Admit: 2014-03-30 | Discharge: 2014-03-30 | Disposition: A | Discharge status: Home / Self Care | Payer: 59 | Source: Ambulatory Visit | Attending: Family Medicine | Admitting: Family Medicine

## 2014-03-30 ENCOUNTER — Other Ambulatory Visit: Payer: Self-pay | Admitting: Family Medicine

## 2014-03-30 DIAGNOSIS — Z01419 Encounter for gynecological examination (general) (routine) without abnormal findings: Secondary | ICD-10-CM

## 2014-03-30 DIAGNOSIS — Z1231 Encounter for screening mammogram for malignant neoplasm of breast: Secondary | ICD-10-CM

## 2014-04-04 ENCOUNTER — Ambulatory Visit: Payer: Self-pay | Admitting: Neurology

## 2014-04-06 ENCOUNTER — Encounter: Payer: Self-pay | Admitting: Family Medicine

## 2014-05-13 ENCOUNTER — Other Ambulatory Visit: Payer: Self-pay | Admitting: Family Medicine

## 2014-09-18 ENCOUNTER — Ambulatory Visit (INDEPENDENT_AMBULATORY_CARE_PROVIDER_SITE_OTHER): Payer: 59 | Admitting: Family Medicine

## 2014-09-18 ENCOUNTER — Encounter: Payer: Self-pay | Admitting: Family Medicine

## 2014-09-18 VITALS — BP 136/82 | HR 75 | Temp 98.0°F | Resp 16 | Ht 63.25 in | Wt 149.2 lb

## 2014-09-18 DIAGNOSIS — E7800 Pure hypercholesterolemia, unspecified: Secondary | ICD-10-CM

## 2014-09-18 DIAGNOSIS — F329 Major depressive disorder, single episode, unspecified: Secondary | ICD-10-CM

## 2014-09-18 DIAGNOSIS — F418 Other specified anxiety disorders: Secondary | ICD-10-CM

## 2014-09-18 DIAGNOSIS — M654 Radial styloid tenosynovitis [de Quervain]: Secondary | ICD-10-CM

## 2014-09-18 DIAGNOSIS — R7302 Impaired glucose tolerance (oral): Secondary | ICD-10-CM

## 2014-09-18 DIAGNOSIS — F419 Anxiety disorder, unspecified: Secondary | ICD-10-CM

## 2014-09-18 DIAGNOSIS — Z23 Encounter for immunization: Secondary | ICD-10-CM

## 2014-09-18 LAB — LIPID PANEL
Cholesterol: 238 mg/dL — ABNORMAL HIGH (ref 0–200)
HDL: 89 mg/dL (ref >39)
LDL Cholesterol: 123 mg/dL — ABNORMAL HIGH (ref 0–99)
Total CHOL/HDL Ratio: 2.7 Ratio
Triglycerides: 129 mg/dL (ref <150)
VLDL: 26 mg/dL (ref 0–40)

## 2014-09-18 LAB — POCT GLYCOSYLATED HEMOGLOBIN (HGB A1C): Hemoglobin A1C: 5.4

## 2014-09-18 LAB — COMPLETE METABOLIC PANEL WITH GFR
ALT: 28 U/L (ref 0–35)
AST: 25 U/L (ref 0–37)
Albumin: 4.4 g/dL (ref 3.5–5.2)
Alkaline Phosphatase: 51 U/L (ref 39–117)
BUN: 16 mg/dL (ref 6–23)
CALCIUM: 9.7 mg/dL (ref 8.4–10.5)
CHLORIDE: 103 meq/L (ref 96–112)
CO2: 26 mEq/L (ref 19–32)
CREATININE: 0.61 mg/dL (ref 0.50–1.10)
GFR, Est African American: >89 mL/min
GFR, Est Non African American: >89 mL/min
Glucose, Bld: 81 mg/dL (ref 70–99)
Potassium: 4.4 mEq/L (ref 3.5–5.3)
Sodium: 139 mEq/L (ref 135–145)
Total Bilirubin: 0.5 mg/dL (ref 0.2–1.2)
Total Protein: 7.2 g/dL (ref 6.0–8.3)

## 2014-09-18 LAB — GLUCOSE, POCT (MANUAL RESULT ENTRY): POC Glucose: 88 mg/dl (ref 70–99)

## 2014-09-18 MED ORDER — MELOXICAM 7.5 MG PO TABS
7.5000 mg | ORAL_TABLET | Freq: Every day | ORAL | Status: DC
Start: 2014-09-18 — End: 2015-03-19

## 2014-09-18 NOTE — Patient Instructions (Addendum)
1.  WEAN WELLBUTRIN TO ONE TABLET EVERY OTHER DAY FOR TWO WEEKS AND THEN STOP.  [facility] MEDICATION IN January. 2.  ICE L WRIST FOR 20 MINUTES ONCE PER DAY FOR TWO WEEKS. 3.  WEAR WRIST SPLINT DAILY FOR TWO WEEKS AND REMOVE SPLINT AT NIGHT. 4. WATCH SUGAR INTAKE.  De Quervain's Disease Harriet Pho disease is a condition often seen in racquet sports where there is a soreness (inflammation) in the cord like structures (tendons) which attach muscle to bone on the thumb side of the wrist. There may be a tightening of the tissuesaround the tendons. This condition is often helped by giving up or modifying the activity which caused it. When conservative treatment does not help, surgery may be required. Conservative treatment could include changes in the activity which brought about the problem or made it worse. Anti-inflammatory medications and injections may be used to help decrease the inflammation and help with pain control. Your caregiver will help you determine which is best for you. DIAGNOSIS  Often the diagnosis (learning what is wrong) can be made by examination. Sometimes x-rays are required. HOME CARE INSTRUCTIONS   Apply ice to the sore area for 15-20 minutes, 03-04 times per day while awake. Put the ice in a plastic bag and place a towel between the bag of ice and your skin. This is especially helpful if it can be done after all activities involving the sore wrist.  Temporary splinting may help.  Only take over-the-counter or prescription medicines for pain, discomfort or fever as directed by your caregiver. SEEK MEDICAL CARE IF:   Pain relief is not obtained with medications, or if you have increasing pain and seem to be getting worse rather than better. MAKE SURE YOU:   Understand these instructions.  Will watch your condition.  Will get help right away if you are not doing well or get worse. Document Released: 07/15/2001 Document Revised: 01/12/2012 Document Reviewed:  02/22/2014 [facility] Patient Information 2015 [place], [place]. This information is not intended to replace advice given to you by your health care provider. Make sure you discuss any questions you have with your health care provider.

## 2014-09-18 NOTE — Progress Notes (Signed)
IDENTIFYING INFORMATION  Olivia Marsh / DOB: 1951-03-31 / MRN: [id number]  The patient has Syncope; Diaphoresis; Pure hypercholesterolemia; Other abnormal glucose; Anxiety and depression; Anancastic neurosis; Acid reflux; and Decreased leukocytes on her problem list.   SUBJECTIVE  Chief Complaint: Follow-up   History of present illness: Olivia Marsh is a 63 y.o. year old female who presents for follow up for anxiety and depression.   She reports her depression started roughly 10 years ago when her children began to leave the home for college.  Her biggest symptom was dysthymic mood, and she has never had anhedonia, sleep disturbance, appetite change, psychomotor slowing, or changes in concentration.  She reports guilt regarding her lack of a college education as well as the fact that she was divorced, and that her children deserved a nuclear family.   Roughly two years ago her husband moved to [place], and she lives in [place].  The patient is happy with this, and states that their relationship is better as a result of the move.  They visit each other on a regular basis.  She reports that upon her husband, who has a PhD in Microbiology and was Clinical biochemist of a Administrator, arts, began to "tell her what to do" and this was not a problem while he was working.      Today she denies dysthymia, still reports some guilt, but states that she does not dwell on these thoughts.  She has excellent social support in her church, and reports that she feels the best she has felt in years.    She denies having had the seasonal flu vaccine and would like this today. and has never has not received the Zostavax, but has an active prescription and plans to fill this at her pharmacy before the prescription expires.    She  has a past medical history of Mild depression; OCD (obsessive compulsive disorder); Anemia; GAD (generalized anxiety disorder); GERD (gastroesophageal reflux disease); Leukopenia; OSA on CPAP;  MDD (major depressive disorder); Heart murmur; Anxiety; and Hypertension.    She has a current medication list which includes the following prescription(s): ascorbic acid, bupropion, calcium carbonate-vitamin d, vitamin d3, fluticasone, centrum silver ultra womens, OVER THE COUNTER MEDICATION, potassium chloride er, venlafaxine hcl, and zoster vaccine live (pf).  Olivia Marsh has No Known Allergies. She  reports that she has never smoked. She does not have any smokeless tobacco history on file. She reports that she drinks alcohol. She reports that she does not use illicit drugs.   The patient  has past surgical history that includes Breast mass excision (Right) and Colonoscopy.  Her family history includes Alcohol abuse in her father; Arthritis in her sister; Cancer in her father; Dementia in her father; Heart disease in her father; Hypertension in her father and mother; Lupus in her mother.  Review of Systems  Constitutional: Negative.   Eyes: Negative.   Respiratory: Negative.   Cardiovascular: Negative.   Gastrointestinal: Negative.   Genitourinary: Negative.   Musculoskeletal: Negative for myalgias.  Skin: Negative.   Neurological: Negative.  Negative for headaches.  Psychiatric/Behavioral: Negative for depression.    OBJECTIVE  Blood pressure 136/82, pulse 75, temperature 98 F (36.7 C), temperature source Oral, resp. rate 16, height 5' 3.25" (1.607 m), weight 149 lb 3.2 oz (67.677 kg), SpO2 97 %. The patient's body mass index is 26.21 kg/(m^2).  Physical Exam  Vitals reviewed. Constitutional: She is oriented to person, place, and time. She appears well-developed and well-nourished.  HENT:  Head: Normocephalic.  Eyes: Conjunctivae and EOM are normal. Pupils are equal, round, and reactive to light.  Cardiovascular: Normal rate, regular rhythm, normal heart sounds and intact distal pulses.   Respiratory: Effort normal and breath sounds normal.  GI: Soft. Bowel sounds are normal.    Musculoskeletal:       Arms: Neurological: She is alert and oriented to person, place, and time. She has normal strength. She is not disoriented. No cranial nerve deficit or sensory deficit.  Skin: Skin is warm, dry and intact.    Mental Status Exam:  Appearance: Well Groomed Eye Contact::  Good Psychomotor:  Normal Speech:  Clear and Coherent and Normal Rate Volume:  Normal Mood:  Euthymic Affect:  Congruent and with mild anxiousness Thought Process:  Linear and Logical Orientation:  Full (Time, Place, and Person) Thought Content:  Negative for hallucination and delusion Suicidal Thoughts:  No Homicidal Thoughts:  No Judgement:  Good Insight:  Good   Results for orders placed or performed in visit on 09/18/14 (from the past 24 hour(s))  POCT glucose (manual entry)     Status: Normal   Collection Time: 09/18/14 11:12 AM  Result Value Ref Range   POC Glucose 88 70 - 99 mg/dl  POCT glycosylated hemoglobin (Hb A1C)     Status: Normal   Collection Time: 09/18/14 11:20 AM  Result Value Ref Range   Hemoglobin A1C 5.4     ASSESSMENT & PLAN  Olivia Marsh was seen today for follow-up.  Diagnoses and associated orders for this visit:  Need for prophylactic vaccination and inoculation against influenza - Flu Vaccine QUAD 36+ mos IM  Anxiety and depression -     Well controlled, and possibly in remission.  Patient instructed to begin weaning off Welbutrin in January.    Elevated cholesterol - Lipid panel  Glucose intolerance (impaired glucose tolerance) - POCT glucose (manual entry) - POCT glycosylated hemoglobin (Hb A1C) - COMPLETE METABOLIC PANEL WITH GFR  De Quervain's tenosynovitis, left - meloxicam (MOBIC) 7.5 MG tablet; Take 1 tablet (7.5 mg total) by mouth daily. - Thumb spica     The patient was instructed to to call or comeback to clinic as needed, or should symptoms warrant.  Philis Fendt, MHS, PA-C [facility] 09/18/2014 11:48 AM

## 2014-09-26 NOTE — Progress Notes (Signed)
History and physical examinations obtained with Philis Fendt, PA-C.  S:  Six month follow-up. Emotionally doing really well at this time; not sure if needs both medications for depression.  Has been trying to monitor sugar intake over the past six months. Has not been monitoring cholesterol intake much at this time.  No weight loss.  Having wrist pain; no injury.  No n/t.  Certain movements of wrist can cause significant pain.  No swelling of wrist or thumb. Pain of wrist does radiate into thumb. Constantly using hands.  O:  CV: RRR; Lungs; CTA B.  Psych: no anxiety or depression; mood is normal; judgement intact.  L wrist: no swelling ;non-tender to palpation; +painful ROM of wrist; full ROM L thumb; Finkelstein's +.  Grip 5/5.  A/P:  1. Anxiety and depression:  Improved; wean Wellbutrin over next month.  Continue Effexor.  2.  Glucose intolerance: New.  Recommend weight loss, exercise, low-sugar and low-carbohydrate intake.  3. Hyperlipidemia: New. Recommend exercise, weight loss, low-cholesterol food choices.  4.  L DeQuervain's  Disease:  New. Thumb spica splint placed.  Recommend icing wrist and thumb daily for two weeks.If no improvement in one month, call for ortho referral.  5. S/p flu vaccine.

## 2014-12-15 ENCOUNTER — Telehealth: Payer: Self-pay

## 2014-12-15 DIAGNOSIS — M654 Radial styloid tenosynovitis [de Quervain]: Secondary | ICD-10-CM

## 2014-12-15 NOTE — Telephone Encounter (Signed)
Dr. Tamala Julian   Patient states tendonitis has spread to the other arm.  Wants to know if she needs to be come in?     [phone]

## 2014-12-19 NOTE — Telephone Encounter (Signed)
Call back ---- is patient still havving pain in original wrist?  Is she now having pain in both wrists? If yes, recommend evaluation by orthopedic.  I am happy to refer her.  Does she have a preference of orthopedics?

## 2014-12-20 NOTE — Telephone Encounter (Signed)
Spoke with pt: 1.) Pain in both wrists, so she needs a referral to ortho. 2.) She does not have a preference on which orthopedist.  Pt changed insurance: [facility] through [place]  MO#[id number] Group # [phone]  Referral placed. FYI Dr. Tamala Julian

## 2014-12-21 NOTE — Telephone Encounter (Signed)
Noted  

## 2014-12-28 ENCOUNTER — Ambulatory Visit (INDEPENDENT_AMBULATORY_CARE_PROVIDER_SITE_OTHER): Payer: 59 | Admitting: Ophthalmology

## 2015-03-19 ENCOUNTER — Other Ambulatory Visit: Payer: Self-pay | Admitting: Family Medicine

## 2015-03-19 ENCOUNTER — Encounter: Payer: Self-pay | Admitting: Family Medicine

## 2015-03-19 ENCOUNTER — Ambulatory Visit (INDEPENDENT_AMBULATORY_CARE_PROVIDER_SITE_OTHER): Payer: BC Managed Care – PPO | Admitting: Family Medicine

## 2015-03-19 VITALS — BP 154/82 | HR 68 | Temp 97.9°F | Resp 16 | Ht 63.25 in | Wt 148.0 lb

## 2015-03-19 DIAGNOSIS — F418 Other specified anxiety disorders: Secondary | ICD-10-CM | POA: Diagnosis not present

## 2015-03-19 DIAGNOSIS — R41840 Attention and concentration deficit: Secondary | ICD-10-CM | POA: Diagnosis not present

## 2015-03-19 DIAGNOSIS — R7302 Impaired glucose tolerance (oral): Secondary | ICD-10-CM | POA: Diagnosis not present

## 2015-03-19 DIAGNOSIS — F329 Major depressive disorder, single episode, unspecified: Secondary | ICD-10-CM

## 2015-03-19 DIAGNOSIS — E785 Hyperlipidemia, unspecified: Secondary | ICD-10-CM

## 2015-03-19 DIAGNOSIS — Z0000 Encounter for general adult medical examination without abnormal findings: Secondary | ICD-10-CM

## 2015-03-19 DIAGNOSIS — F32A Depression, unspecified: Secondary | ICD-10-CM

## 2015-03-19 DIAGNOSIS — Z7251 High risk heterosexual behavior: Secondary | ICD-10-CM

## 2015-03-19 DIAGNOSIS — F419 Anxiety disorder, unspecified: Secondary | ICD-10-CM

## 2015-03-19 DIAGNOSIS — Z209 Contact with and (suspected) exposure to unspecified communicable disease: Secondary | ICD-10-CM

## 2015-03-19 LAB — CBC WITH DIFFERENTIAL/PLATELET
BASOS ABS: 0 10*3/uL (ref 0.0–0.1)
BASOS PCT: 1 % (ref 0–1)
EOS ABS: 0.1 10*3/uL (ref 0.0–0.7)
Eosinophils Relative: 2 % (ref 0–5)
HCT: 41 % (ref 36.0–46.0)
Hemoglobin: 13.9 g/dL (ref 12.0–15.0)
Lymphocytes Relative: 30 % (ref 12–46)
Lymphs Abs: 1.1 10*3/uL (ref 0.7–4.0)
MCH: 29.7 pg (ref 26.0–34.0)
MCHC: 33.9 g/dL (ref 30.0–36.0)
MCV: 87.6 fL (ref 78.0–100.0)
MPV: 8.8 fL (ref 8.6–12.4)
Monocytes Absolute: 0.3 10*3/uL (ref 0.1–1.0)
Monocytes Relative: 7 % (ref 3–12)
NEUTROS PCT: 60 % (ref 43–77)
Neutro Abs: 2.2 10*3/uL (ref 1.7–7.7)
Platelets: 249 10*3/uL (ref 150–400)
RBC: 4.68 MIL/uL (ref 3.87–5.11)
RDW: 13.5 % (ref 11.5–15.5)
WBC: 3.7 10*3/uL — ABNORMAL LOW (ref 4.0–10.5)

## 2015-03-19 LAB — COMPREHENSIVE METABOLIC PANEL
ALT: 22 U/L (ref 0–35)
AST: 23 U/L (ref 0–37)
Albumin: 4.5 g/dL (ref 3.5–5.2)
Alkaline Phosphatase: 48 U/L (ref 39–117)
BUN: 21 mg/dL (ref 6–23)
CALCIUM: 9.3 mg/dL (ref 8.4–10.5)
CHLORIDE: 105 meq/L (ref 96–112)
CO2: 26 meq/L (ref 19–32)
Creat: 0.61 mg/dL (ref 0.50–1.10)
Glucose, Bld: 100 mg/dL — ABNORMAL HIGH (ref 70–99)
POTASSIUM: 3.9 meq/L (ref 3.5–5.3)
Sodium: 140 mEq/L (ref 135–145)
Total Bilirubin: 0.5 mg/dL (ref 0.2–1.2)
Total Protein: 7.1 g/dL (ref 6.0–8.3)

## 2015-03-19 LAB — POCT URINALYSIS DIPSTICK
Bilirubin, UA: NEGATIVE
Glucose, UA: NEGATIVE
LEUKOCYTES UA: NEGATIVE
NITRITE UA: NEGATIVE
PROTEIN UA: NEGATIVE
Spec Grav, UA: 1.02
UROBILINOGEN UA: 0.2
pH, UA: 5.5

## 2015-03-19 LAB — HIV ANTIBODY (ROUTINE TESTING W REFLEX): HIV: NONREACTIVE

## 2015-03-19 LAB — RPR

## 2015-03-19 LAB — LIPID PANEL
Cholesterol: 275 mg/dL — ABNORMAL HIGH (ref 0–200)
HDL: 97 mg/dL (ref >=46)
LDL Cholesterol: 164 mg/dL — ABNORMAL HIGH (ref 0–99)
Total CHOL/HDL Ratio: 2.8 Ratio
Triglycerides: 72 mg/dL (ref <150)
VLDL: 14 mg/dL (ref 0–40)

## 2015-03-19 LAB — TSH: TSH: 1.699 u[IU]/mL (ref 0.350–4.500)

## 2015-03-19 LAB — HEMOGLOBIN A1C
Hgb A1c MFr Bld: 5.8 % — ABNORMAL HIGH (ref <5.7)
MEAN PLASMA GLUCOSE: 120 mg/dL — AB (ref <117)

## 2015-03-19 LAB — VITAMIN B12: VITAMIN B 12: 532 pg/mL (ref 211–911)

## 2015-03-19 LAB — HEPATITIS C ANTIBODY: HCV AB: NEGATIVE

## 2015-03-19 NOTE — Progress Notes (Signed)
   Subjective:    Patient ID: Olivia Marsh, female    DOB: 06-Nov-1950, 64 y.o.   MRN: [id number]  HPI    Review of Systems  Constitutional: Negative.   HENT: Negative.   Eyes: Negative.   Respiratory: Negative.   Cardiovascular: Negative.   Gastrointestinal: Negative.   Endocrine: Negative.   Genitourinary: Negative.   Musculoskeletal: Negative.   Skin: Negative.   Allergic/Immunologic: Negative.   Neurological: Negative.   Hematological: Negative.   Psychiatric/Behavioral: Positive for decreased concentration.       Objective:   Physical Exam        Assessment & Plan:

## 2015-03-19 NOTE — Patient Instructions (Signed)

## 2015-03-19 NOTE — Progress Notes (Signed)
Subjective:    Patient ID: Olivia Marsh, female    DOB: May 30, 1951, 64 y.o.   MRN: [id number]  03/19/2015  No chief complaint on file.   HPI This 64 y.o. female presents for Complete Physical Examination.  Last physical:  03-15-2014 Pap smear:  03-15-2014  WNL HPV negative. Mammogram:  03-31-2014  [facility] Colonoscopy:  2013  [facility]. Bone density:  01/30/2010 TDAP:  03-15-2014 Pneumovax:  never Zostavax: never Influenza:  09-18-2014 Eye exam:  +glasses; every year; no glaucoma or cataracts. Dental exam:  Every six months    Review of Systems  Constitutional: Negative for fever, chills, diaphoresis, activity change, appetite change, fatigue and unexpected weight change.  HENT: Negative for congestion, dental problem, drooling, ear discharge, ear pain, facial swelling, hearing loss, mouth sores, nosebleeds, postnasal drip, rhinorrhea, sinus pressure, sneezing, sore throat, tinnitus, trouble swallowing and voice change.   Eyes: Negative for photophobia, pain, discharge, redness, itching and visual disturbance.  Respiratory: Negative for apnea, cough, choking, chest tightness, shortness of breath, wheezing and stridor.   Cardiovascular: Negative for chest pain, palpitations and leg swelling.  Gastrointestinal: Negative for nausea, vomiting, abdominal pain, diarrhea, constipation, blood in stool, abdominal distention, anal bleeding and rectal pain.  Endocrine: Negative for cold intolerance, heat intolerance, polydipsia, polyphagia and polyuria.  Genitourinary: Negative for dysuria, urgency, frequency, hematuria, flank pain, decreased urine volume, vaginal bleeding, vaginal discharge, enuresis, difficulty urinating, genital sores, vaginal pain, menstrual problem, pelvic pain and dyspareunia.  Musculoskeletal: Negative for myalgias, back pain, joint swelling, arthralgias, gait problem, neck pain and neck stiffness.  Skin: Negative for color change, pallor, rash and  wound.  Allergic/Immunologic: Negative for environmental allergies, food allergies and immunocompromised state.  Neurological: Negative for dizziness, tremors, seizures, syncope, facial asymmetry, speech difficulty, weakness, light-headedness, numbness and headaches.  Hematological: Negative for adenopathy. Does not bruise/bleed easily.  Psychiatric/Behavioral: Negative for suicidal ideas, hallucinations, behavioral problems, confusion, sleep disturbance, self-injury, dysphoric mood, decreased concentration and agitation. The patient is not nervous/anxious and is not hyperactive.     Past Medical History  Diagnosis Date  . Mild depression   . OCD (obsessive compulsive disorder)   . Anemia   . GAD (generalized anxiety disorder)   . GERD (gastroesophageal reflux disease)   . Leukopenia   . OSA on CPAP   . MDD (major depressive disorder)   . Heart murmur     history of  . Anxiety   . Hypertension    Past Surgical History  Procedure Laterality Date  . Breast mass excision Right   . Colonoscopy      [facility].   No Known Allergies Current Outpatient Prescriptions  Medication Sig Dispense Refill  . Ascorbic Acid (VITAMIN C PO) Take by mouth daily.    Marland Kitchen buPROPion (WELLBUTRIN XL) 150 MG 24 hr tablet Take 1 tablet by mouth  daily 30 tablet 5  . Calcium Carbonate-Vitamin D (CALCIUM 600+D3 PO) Take by mouth daily.    . Cholecalciferol (VITAMIN D3) 1000 UNITS CAPS Take by mouth daily.    . fluticasone (FLONASE) 50 MCG/ACT nasal spray Place 2 sprays into the nose daily.    . meloxicam (MOBIC) 7.5 MG tablet Take 1 tablet (7.5 mg total) by mouth daily. 30 tablet 0  . Multiple Vitamins-Minerals (CENTRUM SILVER ULTRA WOMENS) TABS Take by mouth daily.    Marland Kitchen OVER THE COUNTER MEDICATION daily.    . Potassium Chloride ER 20 MEQ TBCR Take 20 mEq by mouth daily.     Marland Kitchen  Venlafaxine HCl (EFFEXOR XR PO) Take 225 mg by mouth daily.    Marland Kitchen zoster vaccine live, PF, (ZOSTAVAX) 02725 UNT/0.65ML injection Inject  19,400 Units into the skin once. 0.65 mL 0   No current facility-administered medications for this visit.       Objective:    There were no vitals taken for this visit. Physical Exam  Constitutional: She is oriented to person, place, and time. She appears well-developed and well-nourished. No distress.  HENT:  Head: Normocephalic and atraumatic.  Right Ear: External ear normal.  Left Ear: External ear normal.  Nose: Nose normal.  Mouth/Throat: Oropharynx is clear and moist.  Eyes: Conjunctivae and EOM are normal. Pupils are equal, round, and reactive to light.  Neck: Normal range of motion and full passive range of motion without pain. Neck supple. No JVD present. Carotid bruit is not present. No thyromegaly present.  Cardiovascular: Normal rate, regular rhythm and normal heart sounds.  Exam reveals no gallop and no friction rub.   No murmur heard. Pulmonary/Chest: Effort normal and breath sounds normal. She has no wheezes. She has no rales.  Abdominal: Soft. Bowel sounds are normal. She exhibits no distension and no mass. There is no tenderness. There is no rebound and no guarding.  Musculoskeletal:       Right shoulder: Normal.       Left shoulder: Normal.       Cervical back: Normal.  Lymphadenopathy:    She has no cervical adenopathy.  Neurological: She is alert and oriented to person, place, and time. She has normal reflexes. No cranial nerve deficit. She exhibits normal muscle tone. Coordination normal.  Skin: Skin is warm and dry. No rash noted. She is not diaphoretic. No erythema. No pallor.  Psychiatric: She has a normal mood and affect. Her behavior is normal. Judgment and thought content normal.  Nursing note and vitals reviewed.  Results for orders placed or performed in visit on 09/18/14  COMPLETE METABOLIC PANEL WITH GFR  Result Value Ref Range   Sodium 139 135 - 145 mEq/L   Potassium 4.4 3.5 - 5.3 mEq/L   Chloride 103 96 - 112 mEq/L   CO2 26 19 - 32 mEq/L    Glucose, Bld 81 70 - 99 mg/dL   BUN 16 6 - 23 mg/dL   Creat 0.61 0.50 - 1.10 mg/dL   Total Bilirubin 0.5 0.2 - 1.2 mg/dL   Alkaline Phosphatase 51 39 - 117 U/L   AST 25 0 - 37 U/L   ALT 28 0 - 35 U/L   Total Protein 7.2 6.0 - 8.3 g/dL   Albumin 4.4 3.5 - 5.2 g/dL   Calcium 9.7 8.4 - 10.5 mg/dL   GFR, Est African American >89 mL/min   GFR, Est Non African American >89 mL/min  Lipid panel  Result Value Ref Range   Cholesterol 238 (H) 0 - 200 mg/dL   Triglycerides 129 <150 mg/dL   HDL 89 >39 mg/dL   Total CHOL/HDL Ratio 2.7 Ratio   VLDL 26 0 - 40 mg/dL   LDL Cholesterol 123 (H) 0 - 99 mg/dL  POCT glucose (manual entry)  Result Value Ref Range   POC Glucose 88 70 - 99 mg/dl  POCT glycosylated hemoglobin (Hb A1C)  Result Value Ref Range   Hemoglobin A1C 5.4        Assessment & Plan:   1. Routine physical examination   2. Glucose intolerance (impaired glucose tolerance)   3. Hyperlipidemia   4.  Anxiety and depression     No orders of the defined types were placed in this encounter.    No Follow-up on file.     [place] Elayne Guerin, M.D. [facility] [address] [place], [place]  [address] [phone] phone [phone] fax

## 2015-03-20 LAB — VITAMIN D 25 HYDROXY (VIT D DEFICIENCY, FRACTURES): VIT D 25 HYDROXY: 34 ng/mL (ref 30–100)

## 2015-03-22 ENCOUNTER — Encounter: Payer: Self-pay | Admitting: *Deleted

## 2015-05-14 ENCOUNTER — Other Ambulatory Visit: Payer: Self-pay

## 2015-05-14 DIAGNOSIS — Z1231 Encounter for screening mammogram for malignant neoplasm of breast: Secondary | ICD-10-CM

## 2015-06-15 ENCOUNTER — Ambulatory Visit
Admission: RE | Admit: 2015-06-15 | Discharge: 2015-06-15 | Disposition: A | Discharge status: Home / Self Care | Payer: BC Managed Care – PPO | Source: Ambulatory Visit

## 2015-06-15 DIAGNOSIS — Z1231 Encounter for screening mammogram for malignant neoplasm of breast: Secondary | ICD-10-CM

## 2015-09-17 ENCOUNTER — Encounter: Payer: Self-pay | Admitting: Family Medicine

## 2015-09-17 ENCOUNTER — Ambulatory Visit (INDEPENDENT_AMBULATORY_CARE_PROVIDER_SITE_OTHER): Payer: BC Managed Care – PPO | Admitting: Family Medicine

## 2015-09-17 VITALS — BP 135/73 | HR 78 | Temp 98.1°F | Resp 16 | Ht 63.25 in | Wt 152.6 lb

## 2015-09-17 DIAGNOSIS — Z23 Encounter for immunization: Secondary | ICD-10-CM | POA: Diagnosis not present

## 2015-09-17 DIAGNOSIS — R7309 Other abnormal glucose: Secondary | ICD-10-CM | POA: Diagnosis not present

## 2015-09-17 DIAGNOSIS — F419 Anxiety disorder, unspecified: Secondary | ICD-10-CM

## 2015-09-17 DIAGNOSIS — F329 Major depressive disorder, single episode, unspecified: Secondary | ICD-10-CM

## 2015-09-17 DIAGNOSIS — F418 Other specified anxiety disorders: Secondary | ICD-10-CM

## 2015-09-17 DIAGNOSIS — E7800 Pure hypercholesterolemia, unspecified: Secondary | ICD-10-CM

## 2015-09-17 DIAGNOSIS — G4733 Obstructive sleep apnea (adult) (pediatric): Secondary | ICD-10-CM | POA: Diagnosis not present

## 2015-09-17 DIAGNOSIS — Z9989 Dependence on other enabling machines and devices: Secondary | ICD-10-CM

## 2015-09-17 LAB — CBC WITH DIFFERENTIAL/PLATELET
BASOS PCT: 1 % (ref 0–1)
Basophils Absolute: 0 10*3/uL (ref 0.0–0.1)
EOS PCT: 1 % (ref 0–5)
Eosinophils Absolute: 0 10*3/uL (ref 0.0–0.7)
HCT: 42.3 % (ref 36.0–46.0)
HEMOGLOBIN: 14.2 g/dL (ref 12.0–15.0)
Lymphocytes Relative: 29 % (ref 12–46)
Lymphs Abs: 1 10*3/uL (ref 0.7–4.0)
MCH: 30.2 pg (ref 26.0–34.0)
MCHC: 33.6 g/dL (ref 30.0–36.0)
MCV: 90 fL (ref 78.0–100.0)
MPV: 9.2 fL (ref 8.6–12.4)
Monocytes Absolute: 0.1 10*3/uL (ref 0.1–1.0)
Monocytes Relative: 4 % (ref 3–12)
NEUTROS ABS: 2.3 10*3/uL (ref 1.7–7.7)
Neutrophils Relative %: 65 % (ref 43–77)
Platelets: 224 10*3/uL (ref 150–400)
RBC: 4.7 MIL/uL (ref 3.87–5.11)
RDW: 12.8 % (ref 11.5–15.5)
WBC: 3.6 10*3/uL — AB (ref 4.0–10.5)

## 2015-09-17 LAB — LIPID PANEL
Cholesterol: 244 mg/dL — ABNORMAL HIGH (ref 125–200)
HDL: 93 mg/dL (ref >=46)
LDL CALC: 131 mg/dL — AB (ref <130)
Total CHOL/HDL Ratio: 2.6 Ratio (ref <=5.0)
Triglycerides: 100 mg/dL (ref <150)
VLDL: 20 mg/dL (ref <30)

## 2015-09-17 LAB — COMPREHENSIVE METABOLIC PANEL
ALT: 23 U/L (ref 6–29)
AST: 23 U/L (ref 10–35)
Albumin: 4.4 g/dL (ref 3.6–5.1)
Alkaline Phosphatase: 54 U/L (ref 33–130)
BILIRUBIN TOTAL: 0.5 mg/dL (ref 0.2–1.2)
BUN: 16 mg/dL (ref 7–25)
CO2: 27 mmol/L (ref 20–31)
Calcium: 9.2 mg/dL (ref 8.6–10.4)
Chloride: 104 mmol/L (ref 98–110)
Creat: 0.61 mg/dL (ref 0.50–0.99)
GLUCOSE: 112 mg/dL — AB (ref 65–99)
Potassium: 3.8 mmol/L (ref 3.5–5.3)
SODIUM: 141 mmol/L (ref 135–146)
Total Protein: 7 g/dL (ref 6.1–8.1)

## 2015-09-17 MED ORDER — ATORVASTATIN CALCIUM 10 MG PO TABS: 10.0000 mg | ORAL_TABLET | Freq: Every day | ORAL | Status: DC

## 2015-09-17 NOTE — Patient Instructions (Signed)

## 2015-09-17 NOTE — Progress Notes (Signed)
Subjective:    Patient ID: TAIWAN SCHOOL, female    DOB: 1950/12/25, 64 y.o.   MRN: [id number]  09/17/2015  Follow-up; Hyperlipidemia; and Depression   HPI This 64 y.o. female presents for six month follow-up:   1. Hypercholesterolemia: recommended starting statin after last visit; cholesterol had increased by 40 points. Prescription not sent to pharmacy.  No previous cholesterol medication in past.  Haven't watched diet lately.  Not exercising.  On the go all the time.  Has a great place to walk.  Ate this morning; chick-fil-a; three minis and round fried potatoes.   Did not remind of fasting.    2.  Anxiety and depression: Patient reports good compliance with medication, good tolerance to medication, and good symptom control.  Has a toddler granddaughter who keeps patient busy.  Keeping on Tuesday and Thursday.   S/p psychological evaluation; no evidence of depression.  Worries about separation.  Taking Effexor with benefit; if misses a day of Effexor, can get depressed and down; excessive crying.  S/p evaluation for ADHD; negative work up.  Did not recommend medication.  No follow-up recommended.  Has an amazing church family; has plenty of money to live on.  Able to support children.  No SI/HI.   3. OSA on CPAP:  Patient reports good compliance with medication, good tolerance to medication, and good symptom control.    4.  Health maintenance:  Needs flu vaccine; has dear friend Geoffery Lyons) at home now.  Wants to visit.   [facility] [place] [facility] completed colonoscopy 2013; no record on chart.    Review of Systems  Constitutional: Negative for fever, chills, diaphoresis and fatigue.  Eyes: Negative for visual disturbance.  Respiratory: Negative for cough and shortness of breath.   Cardiovascular: Negative for chest pain, palpitations and leg swelling.  Gastrointestinal: Negative for nausea, vomiting, abdominal pain, diarrhea and constipation.  Endocrine: Negative for cold intolerance, heat  intolerance, polydipsia, polyphagia and polyuria.  Neurological: Negative for dizziness, tremors, seizures, syncope, facial asymmetry, speech difficulty, weakness, light-headedness, numbness and headaches.  Psychiatric/Behavioral: Negative for suicidal ideas, sleep disturbance, self-injury and dysphoric mood. The patient is nervous/anxious.     Past Medical History  Diagnosis Date  . Mild depression   . OCD (obsessive compulsive disorder)   . Anemia   . GAD (generalized anxiety disorder)   . GERD (gastroesophageal reflux disease)   . Leukopenia   . OSA on CPAP   . MDD (major depressive disorder) ([place])   . Heart murmur     history of  . Anxiety   . Hypertension    Past Surgical History  Procedure Laterality Date  . Breast mass excision Right   . Colonoscopy      [facility].  . Breast surgery     No Known Allergies Current Outpatient Prescriptions  Medication Sig Dispense Refill  . Ascorbic Acid (VITAMIN C PO) Take by mouth daily.    . Calcium Carbonate-Vitamin D (CALCIUM 600+D3 PO) Take by mouth daily.    . Cholecalciferol (VITAMIN D3) 1000 UNITS CAPS Take by mouth daily.    . Multiple Vitamins-Minerals (CENTRUM SILVER ULTRA WOMENS) TABS Take by mouth daily.    . Potassium Chloride ER 20 MEQ TBCR Take 20 mEq by mouth daily.     . Venlafaxine HCl (EFFEXOR XR PO) Take 225 mg by mouth daily.    Marland Kitchen atorvastatin (LIPITOR) 10 MG tablet Take 1 tablet (10 mg total) by mouth daily at 6 PM. 90 tablet 1  .  zoster vaccine live, PF, (ZOSTAVAX) 91478 UNT/0.65ML injection Inject 19,400 Units into the skin once. 0.65 mL 0   No current facility-administered medications for this visit.   Social History   Social History  . Marital Status: Married    Spouse Name: N/A  . Number of Children: N/A  . Years of Education: N/A   Occupational History  . Not on file.   Social History Main Topics  . Smoking status: Never Smoker   . Smokeless tobacco: Not on file  . Alcohol Use: Yes     Comment:  occasional  . Drug Use: No  . Sexual Activity: Not Currently   Other Topics Concern  . Not on file   Social History Narrative   Martial status: married x 18 years; second marriage.  Does not live with husband.  [place].   Mother in [place]; really likes church in [place].      Children: 2 children (38, 31); 1 granddaughter in [place].        Lives: alone; husband lives at [employer].       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       Tobacco: never      Alcohol:  None       Exercise: some days (as of Health Survey on 03/15/2014)      Seatbelt: 100%      Guns: loaded and unsecured.     Family History  Problem Relation Age of Onset  . Hypertension Mother   . Lupus Mother   . Dementia Mother   . Hypertension Father   . Heart disease Father     bypass  . Alcohol abuse Father   . Dementia Father   . Cancer Father     throat cancer; tobacco abuse.  . Arthritis Sister     Rheumatoid Arthritis       Objective:    BP 135/73 mmHg  Pulse 78  Temp(Src) 98.1 F (36.7 C) (Oral)  Resp 16  Ht 5' 3.25" (1.607 m)  Wt 152 lb 9.6 oz (69.219 kg)  BMI 26.80 kg/m2 Physical Exam  Constitutional: She is oriented to person, place, and time. She appears well-developed and well-nourished. No distress.  HENT:  Head: Normocephalic and atraumatic.  Right Ear: External ear normal.  Left Ear: External ear normal.  Nose: Nose normal.  Mouth/Throat: Oropharynx is clear and moist.  Eyes: Conjunctivae and EOM are normal. Pupils are equal, round, and reactive to light.  Neck: Normal range of motion. Neck supple. Carotid bruit is not present. No thyromegaly present.  Cardiovascular: Normal rate, regular rhythm, normal heart sounds and intact distal pulses.  Exam reveals no gallop and no friction rub.   No murmur heard. Pulmonary/Chest: Effort normal and breath sounds normal. She has no wheezes. She has no rales.  Abdominal: Soft. Bowel sounds are normal. She  exhibits no distension and no mass. There is no tenderness. There is no rebound and no guarding.  Lymphadenopathy:    She has no cervical adenopathy.  Neurological: She is alert and oriented to person, place, and time. No cranial nerve deficit.  Skin: Skin is warm and dry. No rash noted. She is not diaphoretic. No erythema. No pallor.  Psychiatric: She has a normal mood and affect. Her behavior is normal.        Assessment & Plan:   1. Pure hypercholesterolemia   2. Other abnormal glucose   3. Anxiety and depression   4. OSA  on CPAP   5. Need for prophylactic vaccination and inoculation against influenza     1. Hypercholesterolemia: uncontrolled; rx for Lipitor 10mg  daily provided.  RTC six months for recheck. 2.  Glucose intolerance: stable; obtain labs; recommend weight loss, exercise, low-sugar food choices. 3.  Anxiety and depression: stable; continue Effexor XR 225mg  daily. 4.  OSA on CPAP: stable; continue CPAP. 5. S/p flu vaccine.   Orders Placed This Encounter  Procedures  . Flu Vaccine QUAD 36+ mos IM  . Comprehensive metabolic panel    Order Specific Question:  Has the patient fasted?    Answer:  Yes  . Lipid panel    Order Specific Question:  Has the patient fasted?    Answer:  Yes  . CBC with Differential/Platelet   Meds ordered this encounter  Medications  . atorvastatin (LIPITOR) 10 MG tablet    Sig: Take 1 tablet (10 mg total) by mouth daily at 6 PM.    Dispense:  90 tablet    Refill:  1    Return in about 6 months (around 03/16/2016) for complete physical examiniation.    Saranya Harlin Elayne Guerin, M.D. [facility] [address] [place], [place]  [address] [phone] phone [phone] fax

## 2015-09-18 ENCOUNTER — Encounter: Payer: Self-pay | Admitting: Family Medicine

## 2015-09-18 ENCOUNTER — Telehealth: Payer: Self-pay | Admitting: Family Medicine

## 2015-09-18 NOTE — Telephone Encounter (Signed)
Please call Dr. Oletta Lamas of [facility] for copy of patient's colonoscopy from 2013; I am PCP and do not have copy.

## 2015-09-18 NOTE — Telephone Encounter (Signed)
Request faxed with confirmation to [facility] for colonoscopy report.

## 2015-09-20 NOTE — Telephone Encounter (Signed)
[facility] faxed a response back stating they need a signed request from the patient before they will send colonoscopy report. Spoke with patient and she will call them and sign release form.

## 2016-01-03 ENCOUNTER — Other Ambulatory Visit: Payer: Self-pay | Admitting: Neurology

## 2016-01-03 DIAGNOSIS — R413 Other amnesia: Secondary | ICD-10-CM

## 2016-01-23 ENCOUNTER — Ambulatory Visit
Admission: RE | Admit: 2016-01-23 | Discharge: 2016-01-23 | Disposition: A | Discharge status: Home / Self Care | Payer: BC Managed Care – PPO | Source: Ambulatory Visit | Attending: Neurology | Admitting: Neurology

## 2016-01-23 DIAGNOSIS — R4189 Other symptoms and signs involving cognitive functions and awareness: Secondary | ICD-10-CM | POA: Insufficient documentation

## 2016-01-23 DIAGNOSIS — I6782 Cerebral ischemia: Secondary | ICD-10-CM | POA: Insufficient documentation

## 2016-01-23 DIAGNOSIS — R413 Other amnesia: Secondary | ICD-10-CM

## 2016-01-23 MED ORDER — GADOBENATE DIMEGLUMINE 529 MG/ML IV SOLN
15.0000 mL | Freq: Once | INTRAVENOUS | Status: AC | PRN
  Administered 2016-01-23: 14 mL via INTRAVENOUS

## 2016-02-03 ENCOUNTER — Ambulatory Visit
Admission: EM | Admit: 2016-02-03 | Discharge: 2016-02-03 | Disposition: A | Discharge status: Home / Self Care | Payer: BC Managed Care – PPO | Attending: Internal Medicine | Admitting: Internal Medicine

## 2016-02-03 ENCOUNTER — Encounter: Payer: Self-pay | Admitting: Emergency Medicine

## 2016-02-03 DIAGNOSIS — J018 Other acute sinusitis: Secondary | ICD-10-CM

## 2016-02-03 DIAGNOSIS — H65111 Acute and subacute allergic otitis media (mucoid) (sanguinous) (serous), right ear: Secondary | ICD-10-CM | POA: Diagnosis not present

## 2016-02-03 LAB — RAPID INFLUENZA A&B ANTIGENS (ARMC ONLY)
INFLUENZA A (ARMC): NEGATIVE
INFLUENZA B (ARMC): NEGATIVE

## 2016-02-03 MED ORDER — BENZONATATE 200 MG PO CAPS: 200.0000 mg | ORAL_CAPSULE | Freq: Three times a day (TID) | ORAL | Status: DC | PRN

## 2016-02-03 MED ORDER — TRIAMCINOLONE ACETONIDE 55 MCG/ACT NA AERO: 2.0000 | INHALATION_SPRAY | Freq: Every day | NASAL | Status: DC

## 2016-02-03 MED ORDER — AMOXICILLIN-POT CLAVULANATE 875-125 MG PO TABS: 1.0000 | ORAL_TABLET | Freq: Two times a day (BID) | ORAL | Status: AC

## 2016-02-03 NOTE — Discharge Instructions (Signed)
Prescriptions for amoxicillin/clavulanate (antibiotic), benzonatate (cough drop), and nasacort (nasal steroid) were sent to the [facility] on [address]. Recheck or followup with PCP/Kristi Tamala Julian if not improving in a few days, for new fever >100.5, or for increasing phlegm production.

## 2016-02-03 NOTE — ED Notes (Signed)
Patient cough, sore throat, chest congestion that started yesterday.

## 2016-02-03 NOTE — ED Provider Notes (Signed)
CSN: [id number]     Arrival date & time 02/03/16  [id number] History   First MD Initiated Contact with Patient 02/03/16 1021     Chief Complaint  Patient presents with  . Cough  . Sore Throat   HPI  Patient is a 65 year old lady with benign past medical history who presents today with the abrupt onset of cough, malaise, severe head congestion, tactile temp, yesterday.  She didn't sleep much last night because of coughing. She did have a flu shot this season. Headache, but not really achy. No nausea/vomiting, no diarrhea.  Past Medical History  Diagnosis Date  . Mild depression   . OCD (obsessive compulsive disorder)   . Anemia   . GAD (generalized anxiety disorder)   . GERD (gastroesophageal reflux disease)   . Leukopenia   . OSA on CPAP   . MDD (major depressive disorder) ([place])   . Heart murmur     history of  . Anxiety   . Hypertension   . Hyperlipidemia    Past Surgical History  Procedure Laterality Date  . Breast mass excision Right   . Colonoscopy      [facility].  . Breast surgery     Family History  Problem Relation Age of Onset  . Hypertension Mother   . Lupus Mother   . Dementia Mother   . Hypertension Father   . Heart disease Father     bypass  . Alcohol abuse Father   . Dementia Father   . Cancer Father     throat cancer; tobacco abuse.  . Arthritis Sister     Rheumatoid Arthritis   Social History  Substance Use Topics  . Smoking status: Never Smoker   . Smokeless tobacco: None  . Alcohol Use: Yes     Comment: occasional   OB History    No data available     Review of Systems  All other systems reviewed and are negative.   Allergies  Review of patient's allergies indicates no known allergies.  Home Medications   Prior to Admission medications   Medication Sig Start Date End Date Taking? Authorizing Provider  Phosphatidylserine-DHA-EPA (VAYACOG) 100-19.5-6.5 MG CAPS Take 1 capsule by mouth daily.   Yes Historical Provider, MD   amoxicillin-clavulanate (AUGMENTIN) 875-125 MG tablet Take 1 tablet by mouth every 12 (twelve) hours. 02/03/16 02/10/16  Sherlene Shams, MD  Ascorbic Acid (VITAMIN C PO) Take by mouth daily.    Historical Provider, MD  atorvastatin (LIPITOR) 10 MG tablet Take 1 tablet (10 mg total) by mouth daily at 6 PM. 09/17/15   [name], MD  benzonatate (TESSALON) 200 MG capsule Take 1 capsule (200 mg total) by mouth 3 (three) times daily as needed for cough. 02/03/16   Sherlene Shams, MD  Calcium Carbonate-Vitamin D (CALCIUM 600+D3 PO) Take by mouth daily.    Historical Provider, MD  Cholecalciferol (VITAMIN D3) 1000 UNITS CAPS Take by mouth daily.    Historical Provider, MD  Multiple Vitamins-Minerals (CENTRUM SILVER ULTRA WOMENS) TABS Take by mouth daily.    Historical Provider, MD  Potassium Chloride ER 20 MEQ TBCR Take 20 mEq by mouth daily.     Historical Provider, MD  triamcinolone (NASACORT AQ) 55 MCG/ACT AERO nasal inhaler Place 2 sprays into the nose daily. 02/03/16   Sherlene Shams, MD  Venlafaxine HCl (EFFEXOR XR PO) Take 225 mg by mouth daily.    Historical Provider, MD  zoster vaccine live, PF, (ZOSTAVAX) 60454 UNT/0.65ML injection  Inject 19,400 Units into the skin once. 03/15/14   [name], MD      BP 161/87 mmHg  Pulse 90  Temp(Src) 99 F (37.2 C) (Tympanic)  Resp 16  Ht 5\' 6"  (1.676 m)  Wt 150 lb (68.04 kg)  BMI 24.22 kg/m2 Physical Exam  Constitutional: She is oriented to person, place, and time.  Alert, nicely groomed Sitting up on the end of the exam table Looks ill but not toxic  HENT:  Head: Atraumatic.  Right TM is quite dull and red, left TM is dull, red tinged Marked nasal congestion Throat is red  Eyes:  Conjugate gaze, no eye redness/drainage  Neck: Neck supple.  Cardiovascular: Normal rate and regular rhythm.   Pulmonary/Chest: No respiratory distress. She has no wheezes. She has no rales.  Lungs clear, symmetric breath sounds  Abdominal: She exhibits no  distension.  Musculoskeletal: Normal range of motion.  No leg swelling  Neurological: She is alert and oriented to person, place, and time.  Skin: Skin is warm and dry.  No cyanosis  Nursing note and vitals reviewed.   ED Course  Procedures (including critical care time)    Results for orders placed or performed during the hospital encounter of 02/03/16  Rapid Influenza A&B Antigens ([facility] only)  Result Value Ref Range   Influenza A ([facility]) NEGATIVE NEGATIVE   Influenza B ([facility]) NEGATIVE NEGATIVE     MDM   1. Acute mucoid otitis media of right ear   2. Other acute sinusitis    Meds ordered this encounter  Medications  .       . triamcinolone (NASACORT AQ) 55 MCG/ACT AERO nasal inhaler    Sig: Place 2 sprays into the nose daily.    Dispense:  1 Inhaler    Refill:  0  . benzonatate (TESSALON) 200 MG capsule    Sig: Take 1 capsule (200 mg total) by mouth 3 (three) times daily as needed for cough.    Dispense:  30 capsule    Refill:  1  . amoxicillin-clavulanate (AUGMENTIN) 875-125 MG tablet    Sig: Take 1 tablet by mouth every 12 (twelve) hours.    Dispense:  14 tablet    Refill:  0   Recheck or followup pcp/Kristi Smith for new fever >100.5, increasing phlegm production, or if not starting to improve in a few days.     Sherlene Shams, MD 02/09/16 [phone]

## 2016-03-04 ENCOUNTER — Ambulatory Visit
Admission: EM | Admit: 2016-03-04 | Discharge: 2016-03-04 | Disposition: A | Discharge status: Home / Self Care | Payer: BC Managed Care – PPO | Attending: Family Medicine | Admitting: Family Medicine

## 2016-03-04 DIAGNOSIS — J069 Acute upper respiratory infection, unspecified: Secondary | ICD-10-CM

## 2016-03-04 DIAGNOSIS — J020 Streptococcal pharyngitis: Secondary | ICD-10-CM | POA: Diagnosis not present

## 2016-03-04 LAB — RAPID STREP SCREEN (MED CTR MEBANE ONLY): Streptococcus, Group A Screen (Direct): POSITIVE — AB

## 2016-03-04 MED ORDER — AMOXICILLIN 875 MG PO TABS: 875.0000 mg | ORAL_TABLET | Freq: Two times a day (BID) | ORAL | Status: DC

## 2016-03-04 NOTE — ED Provider Notes (Signed)
[facility]  ____________________________________________  Time seen: Approximately 11:07 AM  I have reviewed the triage vital signs and the nursing notes.   HISTORY  Chief Complaint Sore Throat   HPI Olivia Marsh is a 65 y.o. female presents for complaints of 3 days of runny nose, nasal congestion occasional cough and occasional scratchy throat. Patient states that she felt like she may have had some seasonal allergies but states that she was concerned that she thought she saw a white patch on her tonsils which prompted her to come in today. Patient denies known sick contacts best does report that she is occasionally around children. Reports she her sister just came back from a weeklong beach trip. Denies fevers. Reports continues to eat and drink well without any difficulties eating or swallowing. Denies fevers.   Denies chest pain, shortness of breath, abdominal pain, dysuria, neck pain, back pain, rash, dizziness or weakness.  No LMP recorded. Patient is postmenopausal.   PCP: Chaya Jan    Past Medical History  Diagnosis Date  . Mild depression   . OCD (obsessive compulsive disorder)   . Anemia   . GAD (generalized anxiety disorder)   . GERD (gastroesophageal reflux disease)   . Leukopenia   . OSA on CPAP   . MDD (major depressive disorder) ([place])   . Heart murmur     history of  . Anxiety   . Hypertension   . Hyperlipidemia     Patient Active Problem List   Diagnosis Date Noted  . OSA on CPAP 09/17/2015  . Pure hypercholesterolemia 03/15/2014  . Other abnormal glucose 03/15/2014  . Anxiety and depression 03/15/2014  . 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 Acid reflux 02/22/2014  . Decreased leukocytes 02/22/2014  . Diaphoresis 01/27/2014  . Syncope 04/08/2013    Past Surgical History  Procedure Laterality Date  . Breast mass excision Right   . Colonoscopy      [place].  . Breast surgery      Current Outpatient Rx  Name  Route  Sig   Dispense  Refill  . atorvastatin (LIPITOR) 10 MG tablet   Oral   Take 1 tablet (10 mg total) by mouth daily at 6 PM.   90 tablet   1   . Calcium Carbonate-Vitamin D (CALCIUM 600+D3 PO)   Oral   Take by mouth daily.         . Cholecalciferol (VITAMIN D3) 1000 UNITS CAPS   Oral   Take by mouth daily.         . Multiple Vitamins-Minerals (CENTRUM SILVER ULTRA WOMENS) TABS   Oral   Take by mouth daily.         . Phosphatidylserine-DHA-EPA (VAYACOG) 100-19.5-6.5 MG CAPS   Oral   Take 1 capsule by mouth daily.         . Venlafaxine HCl (EFFEXOR XR PO)   Oral   Take 225 mg by mouth daily.         Marland Kitchen zoster vaccine live, PF, (ZOSTAVAX) 96295 UNT/0.65ML injection   Subcutaneous   Inject 19,400 Units into the skin once.   0.65 mL   0   . amoxicillin (AMOXIL) 875 MG tablet   Oral   Take 1 tablet (875 mg total) by mouth 2 (two) times daily.   20 tablet   0   . Ascorbic Acid (VITAMIN C PO)   Oral   Take by mouth daily.         . benzonatate (TESSALON)  200 MG capsule   Oral   Take 1 capsule (200 mg total) by mouth 3 (three) times daily as needed for cough.   30 capsule   1   . Potassium Chloride ER 20 MEQ TBCR   Oral   Take 20 mEq by mouth daily.          Marland Kitchen triamcinolone (NASACORT AQ) 55 MCG/ACT AERO nasal inhaler   Nasal   Place 2 sprays into the nose daily.   1 Inhaler   0     Allergies Review of patient's allergies indicates no known allergies.  Family History  Problem Relation Age of Onset  . Hypertension Mother   . Lupus Mother   . Dementia Mother   . Hypertension Father   . Heart disease Father     bypass  . Alcohol abuse Father   . Dementia Father   . Cancer Father     throat cancer; tobacco abuse.  . Arthritis Sister     Rheumatoid Arthritis    Social History Social History  Substance Use Topics  . Smoking status: Never Smoker   . Smokeless tobacco: None  . Alcohol Use: Yes     Comment: occasional    Review of  Systems Constitutional: No fever/chills Eyes: No visual changes. ENT: As above. Cardiovascular: Denies chest pain. Respiratory: Denies shortness of breath. Gastrointestinal: No abdominal pain.  No nausea, no vomiting.  No diarrhea.  No constipation. Genitourinary: Negative for dysuria. Musculoskeletal: Negative for back pain. Skin: Negative for rash. Neurological: Negative for headaches, focal weakness or numbness.  10-point ROS otherwise negative.  ____________________________________________   PHYSICAL EXAM:  VITAL SIGNS: ED Triage Vitals  Enc Vitals Group     BP 03/04/16 1022 124/55 mmHg     Pulse Rate 03/04/16 1022 85     Resp 03/04/16 1022 18     Temp 03/04/16 1022 98.2 F (36.8 C)     Temp src --      SpO2 03/04/16 1022 100 %     Weight 03/04/16 1022 150 lb (68.04 kg)     Height 03/04/16 1022 5\' 2"  (1.575 m)     Head Cir --      Peak Flow --      Pain Score 03/04/16 1027 4     Pain Loc --      Pain Edu? --      Excl. in [place]? --   Constitutional: Alert and oriented. Well appearing and in no acute distress. Eyes: Conjunctivae are normal. PERRL. EOMI. Head: Atraumatic. No sinus tenderness to palpation. No swelling. No erythema.  Ears: no erythema, normal TMs bilaterally.   Nose: Mild nasal congestion with clear rhinorrhea  Mouth/Throat: Mucous membranes are moist. Mild pharyngeal erythema. Mild bilateral tonsillar swelling. No exudate noted. No uvular shift or deviation.  Neck: No stridor.  No cervical spine tenderness to palpation. Hematological/Lymphatic/Immunilogical: No cervical lymphadenopathy. Cardiovascular: Normal rate, regular rhythm. Grossly normal heart sounds.  Good peripheral circulation. Respiratory: Normal respiratory effort.  No retractions. Lungs CTAB.No wheezes, rales or rhonchi. Good air movement.  Gastrointestinal: Soft and nontender. Normal Bowel sounds. No CVA tenderness. Musculoskeletal: No lower or upper extremity tenderness nor edema. No  cervical, thoracic or lumbar tenderness to palpation. Neurologic:  Normal speech and language. No gross focal neurologic deficits are appreciated. No gait instability. Skin:  Skin is warm, dry and intact. No rash noted. Psychiatric: Mood and affect are normal. Speech and behavior are normal.  ____________________________________________   LABS (all labs  ordered are listed, but only abnormal results are displayed)  Labs Reviewed  RAPID STREP SCREEN (NOT AT [facility]) - Abnormal; Notable for the following:    Streptococcus, Group A Screen (Direct) POSITIVE (*)    All other components within normal limits   ____________________________________________  INITIAL IMPRESSION / ASSESSMENT AND PLAN / ED COURSE  Pertinent labs & imaging results that were available during my care of the patient were reviewed by me and considered in my medical decision making (see chart for details).  Very well-appearing patient. No acute distress. Presents for the complaints of runny nose, nasal congestion and sore throat. Quick strep positive. Suspect allergic rhinitis versus viral upper respiratory infection and streptococcal pharyngitis. Discussed treatment options with patient. Will treat with oral amoxicillin twice a day 10 days. Encouraged rest, fluids, over-the-counter Tylenol or ibuprofen as needed. Throat lozenges. Follow with primary care physician as needed.  Discussed follow up with Primary care physician this week. Discussed follow up and return parameters including no resolution or any worsening concerns. Patient verbalized understanding and agreed to plan.    ____________________________________________   FINAL CLINICAL IMPRESSION(S) / ED DIAGNOSES  Final diagnoses:  Strep pharyngitis      Note: This dictation was prepared with Dragon dictation along with smaller phrase technology. Any transcriptional errors that result from this process are unintentional.    Marylene Land, NP 03/04/16  1923

## 2016-03-04 NOTE — Discharge Instructions (Signed)
Take medication as prescribed. Rest. Drink plenty of fluids.   Follow up with your primary care physician this week as needed. Return to Urgent care for new or worsening concerns.    Strep Throat Strep throat is an infection of the throat. It is caused by germs. Strep throat spreads from person to person because of coughing, sneezing, or close contact. HOME CARE Medicines  Take over-the-counter and prescription medicines only as told by your doctor.  Take your antibiotic medicine as told by your doctor. Do not stop taking the medicine even if you feel better.  Have family members who also have a sore throat or fever go to a doctor. Eating and Drinking  Do not share food, drinking cups, or personal items.  Try eating soft foods until your sore throat feels better.  Drink enough fluid to keep your pee (urine) clear or pale yellow. General Instructions  Rinse your mouth (gargle) with a salt-water mixture 3-4 times per day or as needed. To make a salt-water mixture, stir -1 tsp of salt into 1 cup of warm water.  Make sure that all people in your house wash their hands well.  Rest.  Stay home from school or work until you have been taking antibiotics for 24 hours.  Keep all follow-up visits as told by your doctor. This is important. GET HELP IF:  Your neck keeps getting bigger.  You get a rash, cough, or earache.  You cough up thick liquid that is green, yellow-brown, or bloody.  You have pain that does not get better with medicine.  Your problems get worse instead of getting better.  You have a fever. GET HELP RIGHT AWAY IF:  You throw up (vomit).  You get a very bad headache.  You neck hurts or it feels stiff.  You have chest pain or you are short of breath.  You have drooling, very bad throat pain, or changes in your voice.  Your neck is swollen or the skin gets red and tender.  Your mouth is dry or you are peeing less than normal.  You keep feeling more  tired or it is hard to wake up.  Your joints are red or they hurt.   This information is not intended to replace advice given to you by your health care provider. Make sure you discuss any questions you have with your health care provider.   Document Released: 04/07/2008 Document Revised: 07/11/2015 Document Reviewed: 02/12/2015 Elsevier Interactive Patient Education [employer].

## 2016-03-19 ENCOUNTER — Encounter: Payer: Self-pay | Admitting: *Deleted

## 2016-03-19 ENCOUNTER — Ambulatory Visit
Admission: EM | Admit: 2016-03-19 | Discharge: 2016-03-19 | Disposition: A | Discharge status: Home / Self Care | Payer: BC Managed Care – PPO | Attending: Family Medicine | Admitting: Family Medicine

## 2016-03-19 DIAGNOSIS — B9789 Other viral agents as the cause of diseases classified elsewhere: Principal | ICD-10-CM

## 2016-03-19 DIAGNOSIS — J069 Acute upper respiratory infection, unspecified: Secondary | ICD-10-CM | POA: Diagnosis not present

## 2016-03-19 LAB — RAPID STREP SCREEN (MED CTR MEBANE ONLY): STREPTOCOCCUS, GROUP A SCREEN (DIRECT): NEGATIVE

## 2016-03-19 NOTE — ED Notes (Signed)
Sore throat, onset 2 days ago, productive cough-white, onset last night. Denies fever and other symptoms.

## 2016-03-19 NOTE — ED Provider Notes (Signed)
CSN: [id number]     Arrival date & time 03/19/16  1025 History   First MD Initiated Contact with Patient 03/19/16 1047     Chief Complaint  Patient presents with  . Sore Throat  . Cough   (Consider location/radiation/quality/duration/timing/severity/associated sxs/prior Treatment) Patient is a 65 y.o. female presenting with URI.  URI Presenting symptoms: congestion, cough and sore throat   Presenting symptoms: no ear pain, no facial pain and no fever   Severity:  Moderate Onset quality:  Sudden Duration:  2 days Timing:  Constant Progression:  Unchanged Chronicity:  New Relieved by:  None tried Associated symptoms: no headaches and no sinus pain   Risk factors: not elderly, no chronic cardiac disease, no chronic kidney disease, no chronic respiratory disease, no diabetes mellitus, no immunosuppression, no recent illness, no recent travel and no sick contacts     Past Medical History  Diagnosis Date  . Mild depression   . OCD (obsessive compulsive disorder)   . Anemia   . GAD (generalized anxiety disorder)   . GERD (gastroesophageal reflux disease)   . Leukopenia   . OSA on CPAP   . MDD (major depressive disorder) ([place])   . Heart murmur     history of  . Anxiety   . Hypertension   . Hyperlipidemia    Past Surgical History  Procedure Laterality Date  . Breast mass excision Right   . Colonoscopy      [place].  . Breast surgery     Family History  Problem Relation Age of Onset  . Hypertension Mother   . Lupus Mother   . Dementia Mother   . Hypertension Father   . Heart disease Father     bypass  . Alcohol abuse Father   . Dementia Father   . Cancer Father     throat cancer; tobacco abuse.  . Arthritis Sister     Rheumatoid Arthritis   Social History  Substance Use Topics  . Smoking status: Never Smoker   . Smokeless tobacco: None  . Alcohol Use: Yes     Comment: occasional   OB History    No data available     Review of Systems  Constitutional:  Negative for fever.  HENT: Positive for congestion and sore throat. Negative for ear pain.   Respiratory: Positive for cough.   Neurological: Negative for headaches.    Allergies  Review of patient's allergies indicates no known allergies.  Home Medications   Prior to Admission medications   Medication Sig Start Date End Date Taking? Authorizing Provider  atorvastatin (LIPITOR) 10 MG tablet Take 1 tablet (10 mg total) by mouth daily at 6 PM. 09/17/15  Yes [name], MD  Calcium Carbonate-Vitamin D (CALCIUM 600+D3 PO) Take by mouth daily.   Yes Historical Provider, MD  Cholecalciferol (VITAMIN D3) 1000 UNITS CAPS Take by mouth daily.   Yes Historical Provider, MD  Multiple Vitamins-Minerals (CENTRUM SILVER ULTRA WOMENS) TABS Take by mouth daily.   Yes Historical Provider, MD  Phosphatidylserine-DHA-EPA (VAYACOG) 100-19.5-6.5 MG CAPS Take 1 capsule by mouth daily.   Yes Historical Provider, MD  Venlafaxine HCl (EFFEXOR XR PO) Take 225 mg by mouth daily.   Yes Historical Provider, MD  amoxicillin (AMOXIL) 875 MG tablet Take 1 tablet (875 mg total) by mouth 2 (two) times daily. 03/04/16   Marylene Land, NP  Ascorbic Acid (VITAMIN C PO) Take by mouth daily.    Historical Provider, MD  benzonatate (TESSALON) 200 MG capsule  Take 1 capsule (200 mg total) by mouth 3 (three) times daily as needed for cough. 02/03/16   Sherlene Shams, MD  Potassium Chloride ER 20 MEQ TBCR Take 20 mEq by mouth daily.     Historical Provider, MD  triamcinolone (NASACORT AQ) 55 MCG/ACT AERO nasal inhaler Place 2 sprays into the nose daily. 02/03/16   Sherlene Shams, MD  zoster vaccine live, PF, (ZOSTAVAX) 91478 UNT/0.65ML injection Inject 19,400 Units into the skin once. 03/15/14   [name], MD   Meds Ordered and Administered this Visit  Medications - No data to display  BP 135/63 mmHg  Pulse 91  Temp(Src) 97.9 F (36.6 C) (Oral)  Resp 16  Ht 5\' 3"  (1.6 m)  Wt 120 lb (54.432 kg)  BMI 21.26 kg/m2  SpO2  100% No data found.   Physical Exam  Constitutional: She appears well-developed and well-nourished. No distress.  HENT:  Head: Normocephalic and atraumatic.  Right Ear: Tympanic membrane, external ear and ear canal normal.  Left Ear: Tympanic membrane, external ear and ear canal normal.  Nose: Mucosal edema and rhinorrhea present. No nose lacerations, sinus tenderness, nasal deformity, septal deviation or nasal septal hematoma. No epistaxis.  No foreign bodies.  Mouth/Throat: Uvula is midline, oropharynx is clear and moist and mucous membranes are normal. No oropharyngeal exudate.  Eyes: Conjunctivae and EOM are normal. Pupils are equal, round, and reactive to light. Right eye exhibits no discharge. Left eye exhibits no discharge. No scleral icterus.  Neck: Normal range of motion. Neck supple. No thyromegaly present.  Cardiovascular: Normal rate, regular rhythm and normal heart sounds.   Pulmonary/Chest: Effort normal and breath sounds normal. No respiratory distress. She has no wheezes. She has no rales.  Lymphadenopathy:    She has no cervical adenopathy.  Skin: She is not diaphoretic.  Nursing note and vitals reviewed.   ED Course  Procedures (including critical care time)  Labs Review Labs Reviewed  RAPID STREP SCREEN (NOT AT [facility])  CULTURE, GROUP A STREP [facility])    Imaging Review No results found.   Visual Acuity Review  Right Eye Distance:   Left Eye Distance:   Bilateral Distance:    Right Eye Near:   Left Eye Near:    Bilateral Near:         MDM   1. Viral URI with cough    Discharge Medication List as of 03/19/2016 11:25 AM     1. Lab results and diagnosis reviewed with patient 2. Recommend supportive treatment with otc analgesics prn, increased fluids, rest 3. Follow-up prn if symptoms worsen or don't improve    Norval Gable, MD 03/19/16 [phone]

## 2016-03-21 ENCOUNTER — Encounter: Payer: BC Managed Care – PPO | Admitting: Family Medicine

## 2016-03-21 LAB — CULTURE, GROUP A STREP (THRC)

## 2016-03-24 ENCOUNTER — Other Ambulatory Visit: Payer: Self-pay | Admitting: Family Medicine

## 2016-03-29 ENCOUNTER — Other Ambulatory Visit: Payer: Self-pay | Admitting: Family Medicine

## 2016-04-22 ENCOUNTER — Encounter: Payer: BC Managed Care – PPO | Admitting: Family Medicine

## 2016-04-23 ENCOUNTER — Telehealth: Payer: Self-pay

## 2016-04-23 NOTE — Telephone Encounter (Signed)
Patient is calling to request a refill for venlafaxine.  [facility] on [address] In [place] Patient phone: [phone]

## 2016-04-23 NOTE — Telephone Encounter (Signed)
I see in EPIC that Dr Miguel Aschoff sent in #[phone] w/ 3 more refills, which should last her a year, on on 04/01/16 to pt's pharm that she advised below in message. I don't see that we have Rxd this for her before. Need to advise pt that she is overdue for f/up w/Dr Tamala Julian.

## 2016-04-24 NOTE — Telephone Encounter (Signed)
Spoke to pt and gave her info on Dr [facility]. Also discussed f/up w/Dr Tamala Julian. She had to be at a procedure for her mother and had to cancel the appt she had this week, and could not get another until her appt in Dec. I explained our new same/next day appts starting July 3rd and she agreed she will call back to find out Dr Thompson Caul schedule for one of those appts.

## 2016-05-14 ENCOUNTER — Ambulatory Visit (INDEPENDENT_AMBULATORY_CARE_PROVIDER_SITE_OTHER): Payer: Medicare Other | Admitting: Family Medicine

## 2016-05-14 ENCOUNTER — Encounter: Payer: Self-pay | Admitting: Family Medicine

## 2016-05-14 VITALS — BP 122/80 | HR 73 | Temp 98.6°F | Resp 16 | Ht 62.0 in | Wt 144.0 lb

## 2016-05-14 DIAGNOSIS — N6320 Unspecified lump in the left breast, unspecified quadrant: Secondary | ICD-10-CM

## 2016-05-14 DIAGNOSIS — Z124 Encounter for screening for malignant neoplasm of cervix: Secondary | ICD-10-CM

## 2016-05-14 DIAGNOSIS — N63 Unspecified lump in breast: Secondary | ICD-10-CM

## 2016-05-14 DIAGNOSIS — E7800 Pure hypercholesterolemia, unspecified: Secondary | ICD-10-CM | POA: Diagnosis not present

## 2016-05-14 DIAGNOSIS — Z0000 Encounter for general adult medical examination without abnormal findings: Secondary | ICD-10-CM | POA: Diagnosis not present

## 2016-05-14 DIAGNOSIS — G4733 Obstructive sleep apnea (adult) (pediatric): Secondary | ICD-10-CM

## 2016-05-14 DIAGNOSIS — D72819 Decreased white blood cell count, unspecified: Secondary | ICD-10-CM | POA: Diagnosis not present

## 2016-05-14 DIAGNOSIS — R7309 Other abnormal glucose: Secondary | ICD-10-CM

## 2016-05-14 DIAGNOSIS — F329 Major depressive disorder, single episode, unspecified: Secondary | ICD-10-CM

## 2016-05-14 DIAGNOSIS — Z23 Encounter for immunization: Secondary | ICD-10-CM | POA: Diagnosis not present

## 2016-05-14 DIAGNOSIS — Z9989 Dependence on other enabling machines and devices: Secondary | ICD-10-CM

## 2016-05-14 DIAGNOSIS — F419 Anxiety disorder, unspecified: Secondary | ICD-10-CM

## 2016-05-14 DIAGNOSIS — E2839 Other primary ovarian failure: Secondary | ICD-10-CM | POA: Diagnosis not present

## 2016-05-14 DIAGNOSIS — F418 Other specified anxiety disorders: Secondary | ICD-10-CM

## 2016-05-14 DIAGNOSIS — K219 Gastro-esophageal reflux disease without esophagitis: Secondary | ICD-10-CM

## 2016-05-14 LAB — HEMOGLOBIN A1C
HEMOGLOBIN A1C: 5.8 % — AB (ref <5.7)
MEAN PLASMA GLUCOSE: 120 mg/dL

## 2016-05-14 LAB — LIPID PANEL
CHOLESTEROL: 249 mg/dL — AB (ref 125–200)
HDL: 83 mg/dL (ref >=46)
LDL Cholesterol: 150 mg/dL — ABNORMAL HIGH (ref <130)
Total CHOL/HDL Ratio: 3 Ratio (ref <=5.0)
Triglycerides: 80 mg/dL (ref <150)
VLDL: 16 mg/dL (ref <30)

## 2016-05-14 LAB — COMPREHENSIVE METABOLIC PANEL
ALBUMIN: 4.2 g/dL (ref 3.6–5.1)
ALK PHOS: 49 U/L (ref 33–130)
ALT: 22 U/L (ref 6–29)
AST: 24 U/L (ref 10–35)
BILIRUBIN TOTAL: 0.4 mg/dL (ref 0.2–1.2)
BUN: 15 mg/dL (ref 7–25)
CALCIUM: 9.4 mg/dL (ref 8.6–10.4)
CO2: 28 mmol/L (ref 20–31)
Chloride: 105 mmol/L (ref 98–110)
Creat: 0.66 mg/dL (ref 0.50–0.99)
Glucose, Bld: 89 mg/dL (ref 65–99)
Potassium: 3.7 mmol/L (ref 3.5–5.3)
Sodium: 141 mmol/L (ref 135–146)
TOTAL PROTEIN: 6.8 g/dL (ref 6.1–8.1)

## 2016-05-14 LAB — POCT URINALYSIS DIP (MANUAL ENTRY)
BILIRUBIN UA: NEGATIVE
BILIRUBIN UA: NEGATIVE
Glucose, UA: NEGATIVE
Leukocytes, UA: NEGATIVE
NITRITE UA: NEGATIVE
PH UA: 7.5
PROTEIN UA: NEGATIVE
Spec Grav, UA: 1.015
Urobilinogen, UA: 0.2

## 2016-05-14 LAB — CBC WITH DIFFERENTIAL/PLATELET
BASOS PCT: 0 %
Basophils Absolute: 0 cells/uL (ref 0–200)
EOS ABS: 72 {cells}/uL (ref 15–500)
Eosinophils Relative: 2 %
HEMATOCRIT: 42.9 % (ref 35.0–45.0)
HEMOGLOBIN: 14.1 g/dL (ref 11.7–15.5)
LYMPHS ABS: 1116 {cells}/uL (ref 850–3900)
Lymphocytes Relative: 31 %
MCH: 29.8 pg (ref 27.0–33.0)
MCHC: 32.9 g/dL (ref 32.0–36.0)
MCV: 90.7 fL (ref 80.0–100.0)
MPV: 8.9 fL (ref 7.5–12.5)
Monocytes Absolute: 252 cells/uL (ref 200–950)
Monocytes Relative: 7 %
Neutro Abs: 2160 cells/uL (ref 1500–7800)
Neutrophils Relative %: 60 %
Platelets: 228 10*3/uL (ref 140–400)
RBC: 4.73 MIL/uL (ref 3.80–5.10)
RDW: 13.1 % (ref 11.0–15.0)
WBC: 3.6 10*3/uL — AB (ref 3.8–10.8)

## 2016-05-14 LAB — POC MICROSCOPIC URINALYSIS (UMFC)

## 2016-05-14 NOTE — Progress Notes (Signed)
Subjective:    Patient ID: Olivia Marsh, female    DOB: Aug 25, 1951, 65 y.o.   MRN: [id number]  05/14/2016  Annual Exam   HPI This 65 y.o. female presents for Welcome to Medicare Physical and Routine Physical Examination.  Last physical:  03-19-2015 Pap smear: 03/15/2014 Mammogram: 06/15/15 Colonoscopy:  2013 TDAP: 2015 Pneumovax: never Zostavax: 2017; shingles; very mild.  Last year. 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  Upon review of MRI in system, chronic small vessel ischemic changes affecting the pons white matter.  Taking Atorvastatin 10mg  daily until he stopped it?  Started Aricept and vitamin.     Review of Systems  Constitutional: Negative for fever, chills, diaphoresis, activity change, appetite change, fatigue and unexpected weight change.  HENT: Negative for congestion, dental problem, drooling, ear discharge, ear pain, facial swelling, hearing loss, mouth sores, nosebleeds, postnasal drip, rhinorrhea, sinus pressure, sneezing, sore throat, tinnitus, trouble swallowing and voice change.   Eyes: Negative for photophobia, pain, discharge, redness, itching and visual disturbance.  Respiratory: Negative for apnea, cough, choking, chest tightness, shortness of breath, wheezing and stridor.   Cardiovascular: Negative for chest pain, palpitations and leg swelling.  Gastrointestinal: Negative for nausea, vomiting, abdominal pain, diarrhea, constipation, blood in stool, abdominal distention, anal bleeding and rectal pain.  Endocrine: Negative for cold intolerance, heat intolerance, polydipsia, polyphagia and polyuria.  Genitourinary: Negative for decreased urine volume, difficulty urinating, dyspareunia, dysuria, enuresis, flank pain, frequency, genital sores, hematuria, menstrual problem, pelvic pain, urgency, vaginal bleeding, vaginal discharge and  vaginal pain.       NO URINARY LEAKAGE.  Musculoskeletal: Negative for myalgias, back pain, joint swelling, arthralgias, gait problem, neck pain and neck stiffness.  Skin: Negative for color change, pallor, rash and wound.  Allergic/Immunologic: Negative for environmental allergies, food allergies and immunocompromised state.  Neurological: Negative for dizziness, tremors, seizures, syncope, facial asymmetry, speech difficulty, weakness, light-headedness, numbness and headaches.  Hematological: Negative for adenopathy. Does not bruise/bleed easily.  Psychiatric/Behavioral: Negative for suicidal ideas, hallucinations, behavioral problems, confusion, sleep disturbance, self-injury, dysphoric mood, decreased concentration and agitation. The patient is not nervous/anxious and is not hyperactive.     Past Medical History:  Diagnosis Date  . Anemia   . Anxiety   . GAD (generalized anxiety disorder)   . GERD (gastroesophageal reflux disease)   . Heart murmur    history of  . Hyperlipidemia   . Hypertension   . Leukopenia   . MDD (major depressive disorder) ([place])   . Mild depression   . OCD (obsessive compulsive disorder)   . OSA on CPAP    Past Surgical History:  Procedure Laterality Date  . BREAST MASS EXCISION Right   . BREAST SURGERY    . COLONOSCOPY     [place].   No Known Allergies Current Outpatient Prescriptions  Medication Sig Dispense Refill  . atorvastatin (LIPITOR) 10 MG tablet Take by mouth.    . Multiple Vitamins-Minerals (CENTRUM SILVER ULTRA WOMENS) TABS Take by mouth daily.    . Phosphatidylserine-DHA-EPA (VAYACOG) 100-19.5-6.5 MG CAPS Take 1 capsule by mouth daily.    . Venlafaxine HCl 225 MG TB24 TAKE 1 TABLET BY MOUTH DAILY 90 tablet 3  . Ascorbic Acid (VITAMIN C PO) Take by mouth daily. Reported on 05/14/2016    . atorvastatin (LIPITOR) 10 MG tablet TAKE 1 TABLET (10 MG TOTAL)  BY MOUTH DAILY AT 6 PM. 90 tablet 2  . Calcium Carbonate-Vitamin D (CALCIUM 600+D3 PO)  Take by mouth daily. Reported on 05/14/2016    . Cholecalciferol (VITAMIN D3) 1000 UNITS CAPS Take by mouth daily. Reported on 05/14/2016    . donepezil (ARICEPT) 10 MG tablet TAKE 1/2 TABLET AT BEDTIME FOR 30 DAYS THEN INCREASE TO 1 TABLET AT BEDTIME  6  . Multiple Vitamins-Minerals (CENTRUM SILVER ULTRA WOMENS) TABS Take by mouth.    . Potassium Chloride ER 20 MEQ TBCR Take 20 mEq by mouth daily. Reported on 05/14/2016    . triamcinolone (NASACORT AQ) 55 MCG/ACT AERO nasal inhaler Place 2 sprays into the nose daily. (Patient not taking: Reported on 05/14/2016) 1 Inhaler 0  . Venlafaxine HCl (EFFEXOR XR PO) Take 225 mg by mouth daily. Reported on 05/14/2016    . zoster vaccine live, PF, (ZOSTAVAX) 16109 UNT/0.65ML injection Inject 19,400 Units into the skin once. 0.65 mL 0   No current facility-administered medications for this visit.    Social History   Social History  . Marital status: Divorced    Spouse name: N/A  . Number of children: 2  . Years of education: N/A   Occupational History  . unemployed    Social History Main Topics  . Smoking status: Never Smoker  . Smokeless tobacco: Not on file  . Alcohol use Yes     Comment: occasional  . Drug use: No  . Sexual activity: Not Currently   Other Topics Concern  . Not on file   Social History Narrative   Martial status: married x 19 years; second marriage.  Does not live with husband.  [place].   Mother in [place]; really likes church in [place].      Children: 2 children (38, 31); 1 granddaughter in [place].        Lives: alone; husband lives at [employer].       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       Tobacco: never      Alcohol:  None       Exercise: some days (as of Health Survey on 03/15/2014)      Seatbelt: 100%      Guns: loaded and unsecured.     Family History  Problem Relation Age of Onset  . Hypertension Mother   . Lupus Mother   . Dementia Mother   . Heart disease  Mother 80    aortic valve replacement  . Hypertension Father   . Heart disease Father     bypass  . Alcohol abuse Father   . Dementia Father   . Cancer Father     throat cancer; tobacco abuse.  . Arthritis Sister     Rheumatoid Arthritis       Objective:    BP 122/80   Pulse 73   Temp 98.6 F (37 C) (Oral)   Resp 16   Ht 5\' 2"  (1.575 m)   Wt 144 lb (65.3 kg)   SpO2 98%   BMI 26.34 kg/m  Physical Exam  Constitutional: She is oriented to person, place, and time. She appears well-developed and well-nourished. No distress.  HENT:  Head: Normocephalic and atraumatic.  Right Ear: External ear normal.  Left Ear: External ear normal.  Nose: Nose normal.  Mouth/Throat: Oropharynx is clear and moist.  Eyes: Conjunctivae and EOM are normal. Pupils are equal, round, and reactive to light.  Neck: Normal range of motion  and full passive range of motion without pain. Neck supple. No JVD present. Carotid bruit is not present. No thyromegaly present.  Cardiovascular: Normal rate, regular rhythm and normal heart sounds.  Exam reveals no gallop and no friction rub.   No murmur heard. Pulmonary/Chest: Effort normal and breath sounds normal. She has no wheezes. She has no rales. Right breast exhibits no inverted nipple, no mass, no nipple discharge, no skin change and no tenderness. Left breast exhibits no inverted nipple, no mass, no nipple discharge, no skin change and no tenderness. Breasts are symmetrical.    +palpable density upper outer quadrant at 1 oclock.  Abdominal: Soft. Bowel sounds are normal. She exhibits no distension and no mass. There is no tenderness. There is no rebound and no guarding.  Genitourinary: Vagina normal. There is no rash, tenderness, lesion or injury on the right labia. There is no rash, tenderness, lesion or injury on the left labia.  Musculoskeletal:       Right shoulder: Normal.       Left shoulder: Normal.       Cervical back: Normal.  Lymphadenopathy:      She has no cervical adenopathy.  Neurological: She is alert and oriented to person, place, and time. She has normal reflexes. No cranial nerve deficit. She exhibits normal muscle tone. Coordination normal.  Skin: Skin is warm and dry. No rash noted. She is not diaphoretic. No erythema. No pallor.  Psychiatric: She has a normal mood and affect. Her behavior is normal. Judgment and thought content normal.  Nursing note and vitals reviewed.  Depression screen [facility] 2/9 05/14/2016 09/17/2015 03/19/2015 09/18/2014 03/15/2014  Decreased Interest 0 0 0 0 0  Down, Depressed, Hopeless 0 0 0 0 0  PHQ - 2 Score 0 0 0 0 0  Functional Status Survey: Is the patient deaf or have difficulty hearing?: No Does the patient have difficulty seeing, even when wearing glasses/contacts?: No Does the patient have difficulty concentrating, remembering, or making decisions?: Yes Does the patient have difficulty walking or climbing stairs?: No Does the patient have difficulty dressing or bathing?: No Does the patient have difficulty doing errands alone such as visiting a doctor's office or shopping?: No  Fall Risk  05/14/2016 09/17/2015 03/19/2015 09/18/2014 03/15/2014  Falls in the past year? No No No No No   Immunization History  Administered Date(s) Administered  . Influenza,inj,Quad PF,36+ Mos 09/18/2014, 09/17/2015  . Pneumococcal Conjugate-13 05/14/2016  . Tdap 03/15/2014  . Zoster 11/04/2015       Assessment & Plan:   1. Routine physical examination   2. Welcome to Medicare preventive visit   3. Pure hypercholesterolemia   4. Other abnormal glucose   5. Anxiety and depression   6. Decreased leukocytes   7. Gastroesophageal reflux disease without esophagitis   8. OSA on CPAP   9. Cervical cancer screening   10. Left breast mass   11. Estrogen deficiency    -anticipatory guidance provided -undergoing treatment for anxiety and depression. -low fall risk; no hearing loss.  NO URINARY INCONTINENCE.   Has living will with [facility]CP[facility].  -s/p Prevnar 13.   -pap smear obtained. -refer for diagnostic mammogram and breast US L due to increased density L upper outer quadrant. -obtain age appropriate labs. -refill of medications provided; chronic medical conditions stable. S/p MRI that revealed diffuse white matter changes; concern for vascular dementia mild versus TIA.  Uptitrate Lipitor at next visit or if LDL elevated with labs and continue Aricept.   -  refer for bone density   Orders Placed This Encounter  Procedures  . MM DIAG BREAST TOMO BILATERAL    PF: 06/15/2015 BCG/ NO IMPLANTS/ NO HX BR CA/ NO NEEDS/ EPIC ORDER INS:  BCBS JTB/PT    Standing Status:   Future    Number of Occurrences:   1    Standing Expiration Date:   07/15/2017    Order Specific Question:   Reason for Exam (SYMPTOM  OR DIAGNOSIS REQUIRED)    Answer:   L breast nodule 3:00pm    Order Specific Question:   Preferred imaging location?    Answer:   [facility]  . DG Bone Density    PF:  NONE/ NO SUPP/ NO NEEDS/EPIC ORDER INS:  BCBS JTB/PT     Standing Status:   Future    Number of Occurrences:   1    Standing Expiration Date:   07/15/2017    Order Specific Question:   Reason for Exam (SYMPTOM  OR DIAGNOSIS REQUIRED)    Answer:   estrogen deficiency    Order Specific Question:   Preferred imaging location?    Answer:   [facility]  . Pneumococcal conjugate vaccine 13-valent IM  . CBC with Differential/Platelet  . Comprehensive metabolic panel    Order Specific Question:   Has the patient fasted?    Answer:   Yes  . Hemoglobin A1c  . Lipid panel    Order Specific Question:   Has the patient fasted?    Answer:   Yes  . POCT urinalysis dipstick  . POCT Microscopic Urinalysis ([facility])  . EKG 12-Lead   Meds ordered this encounter  Medications  . atorvastatin (LIPITOR) 10 MG tablet    Sig: Take by mouth.  . Multiple Vitamins-Minerals (CENTRUM SILVER ULTRA WOMENS) TABS    Sig: Take by mouth.  .  Phosphatidylserine-DHA-EPA (VAYACOG PO)    Sig: Take by mouth.  . donepezil (ARICEPT) 10 MG tablet    Sig: TAKE 1/2 TABLET AT BEDTIME FOR 30 DAYS THEN INCREASE TO 1 TABLET AT BEDTIME    Refill:  6    Return in about 6 months (around 11/14/2016) for recheck.    Isaiah Cianci Elayne Guerin, M.D. [facility] [address] [place], [place]  [address] [phone] phone [phone] fax

## 2016-05-14 NOTE — Patient Instructions (Addendum)
   IF you received an x-ray today, you will receive an invoice from [facility]. Please contact [facility] at [phone] with questions or concerns regarding your invoice.   IF you received labwork today, you will receive an invoice from [facility]. Please contact [facility] at [phone] with questions or concerns regarding your invoice.   Our billing staff will not be able to assist you with questions regarding bills from these companies.  You will be contacted with the lab results as soon as they are available. The fastest way to get your results is to activate your My Chart account. Instructions are located on the last page of this paperwork. If you have not heard from us regarding the results in 2 weeks, please contact this office.    Keeping You Healthy  Get These Tests  Blood Pressure- Have your blood pressure checked by your healthcare provider at least once a year.  Normal blood pressure is 120/80.  Weight- Have your body mass index (BMI) calculated to screen for obesity.  BMI is a measure of body fat based on height and weight.  You can calculate your own BMI at www.nhlbisupport.com/bmi/  Cholesterol- Have your cholesterol checked every year.  Diabetes- Have your blood sugar checked every year if you have high blood pressure, high cholesterol, a family history of diabetes or if you are overweight.  Pap Test - Have a pap test every 1 to 5 years if you have been sexually active.  If you are older than 65 and recent pap tests have been normal you may not need additional pap tests.  In addition, if you have had a hysterectomy  for benign disease additional pap tests are not necessary.  Mammogram-Yearly mammograms are essential for early detection of breast cancer  Screening for Colon Cancer- Colonoscopy starting at age 50. Screening may begin sooner depending on your family history and other health conditions.  Follow up colonoscopy  as directed by your Gastroenterologist.  Screening for Osteoporosis- Screening begins at age 65 with bone density scanning, sooner if you are at higher risk for developing Osteoporosis.  Get these medicines  Calcium with Vitamin D- Your body requires 1200-1500 mg of Calcium a day and 800-1000 IU of Vitamin D a day.  You can only absorb 500 mg of Calcium at a time therefore Calcium must be taken in 2 or 3 separate doses throughout the day.  Hormones- Hormone therapy has been associated with increased risk for certain cancers and heart disease.  Talk to your healthcare provider about if you need relief from menopausal symptoms.  Aspirin- Ask your healthcare provider about taking Aspirin to prevent Heart Disease and Stroke.  Get these Immuniztions  Flu shot- Every fall  Pneumonia shot- Once after the age of 65; if you are younger ask your healthcare provider if you need a pneumonia shot.  Tetanus- Every ten years.  Zostavax- Once after the age of 60 to prevent shingles.  Take these steps  Don't smoke- Your healthcare provider can help you quit. For tips on how to quit, ask your healthcare provider or go to www.smokefree.gov or call 1-800 QUIT-NOW.  Be physically active- Exercise 5 days a week for a minimum of 30 minutes.  If you are not already physically active, start slow and gradually work up to 30 minutes of moderate physical activity.  Try walking, dancing, bike riding, swimming, etc.  Eat a healthy diet- Eat a variety of healthy foods such as fruits, vegetables, whole   grains, low fat milk, low fat cheeses, yogurt, lean meats, chicken, fish, eggs, dried beans, tofu, etc.  For more information go to www.thenutritionsource.org  Dental visit- Brush and floss teeth twice daily; visit your dentist twice a year.  Eye exam- Visit your Optometrist or Ophthalmologist yearly.  Drink alcohol in moderation- Limit alcohol intake to one drink or less a day.  Never drink and  drive.  Depression- Your emotional health is as important as your physical health.  If you're feeling down or losing interest in things you normally enjoy, please talk to your healthcare provider.  Seat Belts- can save your life; always wear one  Smoke/Carbon Monoxide detectors- These detectors need to be installed on the appropriate level of your home.  Replace batteries at least once a year.  Violence- If anyone is threatening or hurting you, please tell your healthcare provider.  Living Will/ Health care power of attorney- Discuss with your healthcare provider and family. 

## 2016-05-15 LAB — PAP IG W/ RFLX HPV ASCU

## 2016-05-16 ENCOUNTER — Telehealth: Payer: Self-pay

## 2016-05-16 DIAGNOSIS — N6320 Unspecified lump in the left breast, unspecified quadrant: Secondary | ICD-10-CM

## 2016-05-16 NOTE — Telephone Encounter (Signed)
An order for a breast ultrasound will need to be added in order to schedule the diagnostic mammogram.  Please add the order, and then the patient can be scheduled at [facility].  Thank you.

## 2016-05-19 NOTE — Telephone Encounter (Signed)
Order for LEFT breast US placed.

## 2016-05-26 ENCOUNTER — Telehealth: Payer: Self-pay

## 2016-05-26 NOTE — Telephone Encounter (Signed)
Dr Tamala Julian   Patient has a scheduled mammo with the breast center.  No need for a referral.

## 2016-05-30 ENCOUNTER — Ambulatory Visit
Admission: RE | Admit: 2016-05-30 | Discharge: 2016-05-30 | Disposition: A | Discharge status: Home / Self Care | Payer: Medicare Other | Source: Ambulatory Visit | Attending: Family Medicine | Admitting: Family Medicine

## 2016-05-30 ENCOUNTER — Other Ambulatory Visit: Payer: Self-pay | Admitting: Family Medicine

## 2016-05-30 ENCOUNTER — Ambulatory Visit
Admission: RE | Admit: 2016-05-30 | Discharge: 2016-05-30 | Disposition: A | Discharge status: Home / Self Care | Payer: BC Managed Care – PPO | Source: Ambulatory Visit | Attending: Family Medicine | Admitting: Family Medicine

## 2016-05-30 DIAGNOSIS — N6320 Unspecified lump in the left breast, unspecified quadrant: Secondary | ICD-10-CM

## 2016-05-30 DIAGNOSIS — E2839 Other primary ovarian failure: Secondary | ICD-10-CM

## 2016-06-19 ENCOUNTER — Other Ambulatory Visit: Payer: Self-pay | Admitting: Family Medicine

## 2016-07-10 ENCOUNTER — Encounter: Payer: Self-pay | Admitting: Family Medicine

## 2016-07-11 ENCOUNTER — Encounter: Payer: Self-pay | Admitting: Family Medicine

## 2016-10-21 ENCOUNTER — Encounter: Payer: BC Managed Care – PPO | Admitting: Family Medicine

## 2016-10-21 ENCOUNTER — Ambulatory Visit (INDEPENDENT_AMBULATORY_CARE_PROVIDER_SITE_OTHER): Payer: Medicare Other | Admitting: Family Medicine

## 2016-10-21 ENCOUNTER — Encounter: Payer: Self-pay | Admitting: Family Medicine

## 2016-10-21 VITALS — BP 118/66 | HR 66 | Temp 99.0°F | Resp 16 | Ht 62.0 in | Wt 136.0 lb

## 2016-10-21 DIAGNOSIS — F418 Other specified anxiety disorders: Secondary | ICD-10-CM | POA: Diagnosis not present

## 2016-10-21 DIAGNOSIS — Z23 Encounter for immunization: Secondary | ICD-10-CM

## 2016-10-21 DIAGNOSIS — Z9989 Dependence on other enabling machines and devices: Secondary | ICD-10-CM

## 2016-10-21 DIAGNOSIS — R413 Other amnesia: Secondary | ICD-10-CM

## 2016-10-21 DIAGNOSIS — K219 Gastro-esophageal reflux disease without esophagitis: Secondary | ICD-10-CM

## 2016-10-21 DIAGNOSIS — G4733 Obstructive sleep apnea (adult) (pediatric): Secondary | ICD-10-CM

## 2016-10-21 DIAGNOSIS — D708 Other neutropenia: Secondary | ICD-10-CM | POA: Diagnosis not present

## 2016-10-21 DIAGNOSIS — E7800 Pure hypercholesterolemia, unspecified: Secondary | ICD-10-CM | POA: Diagnosis not present

## 2016-10-21 DIAGNOSIS — F419 Anxiety disorder, unspecified: Secondary | ICD-10-CM

## 2016-10-21 DIAGNOSIS — F329 Major depressive disorder, single episode, unspecified: Secondary | ICD-10-CM

## 2016-10-21 DIAGNOSIS — R7309 Other abnormal glucose: Secondary | ICD-10-CM

## 2016-10-21 MED ORDER — ESCITALOPRAM OXALATE 5 MG PO TABS: 5.0000 mg | ORAL_TABLET | Freq: Every day | ORAL | 1 refills | Status: DC

## 2016-10-21 MED ORDER — VENLAFAXINE HCL ER 150 MG PO TB24: 150.0000 mg | ORAL_TABLET | Freq: Every day | ORAL | 0 refills | Status: DC

## 2016-10-21 MED ORDER — ATORVASTATIN CALCIUM 20 MG PO TABS: 20.0000 mg | ORAL_TABLET | Freq: Every day | ORAL | 1 refills | Status: DC
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
## 2016-10-21 NOTE — Patient Instructions (Addendum)
1. Increase Atorvastatin to 20mg  daily. 2.  Finish Effexor ER 225mg  in upcoming two weeks. 3. Then, start Lexapro 5mg  daily and decrease Effexor/Venlafaxine to 150mg  daily.      IF you received an x-ray today, you will receive an invoice from [facility]. Please contact [facility] at [phone] with questions or concerns regarding your invoice.   IF you received labwork today, you will receive an invoice from [facility]. Please contact [facility] at [phone] with questions or concerns regarding your invoice.   Our billing staff will not be able to assist you with questions regarding bills from these companies.  You will be contacted with the lab results as soon as they are available. The fastest way to get your results is to activate your My Chart account. Instructions are located on the last page of this paperwork. If you have not heard from [place] regarding the results in 2 weeks, please contact this office.

## 2016-10-22 LAB — COMPREHENSIVE METABOLIC PANEL
ALT: 25 IU/L (ref 0–32)
AST: 22 IU/L (ref 0–40)
Albumin/Globulin Ratio: 2 (ref 1.2–2.2)
Albumin: 4.5 g/dL (ref 3.6–4.8)
Alkaline Phosphatase: 57 IU/L (ref 39–117)
BUN/Creatinine Ratio: 26 (ref 12–28)
BUN: 15 mg/dL (ref 8–27)
Bilirubin Total: 0.3 mg/dL (ref 0.0–1.2)
CALCIUM: 9.3 mg/dL (ref 8.7–10.3)
CO2: 28 mmol/L (ref 18–29)
Chloride: 99 mmol/L (ref 96–106)
Creatinine, Ser: 0.58 mg/dL (ref 0.57–1.00)
GFR calc Af Amer: 112 mL/min/{1.73_m2} (ref >59)
GFR, EST NON AFRICAN AMERICAN: 97 mL/min/{1.73_m2} (ref >59)
GLUCOSE: 91 mg/dL (ref 65–99)
Globulin, Total: 2.3 g/dL (ref 1.5–4.5)
POTASSIUM: 3.4 mmol/L — AB (ref 3.5–5.2)
Sodium: 142 mmol/L (ref 134–144)
TOTAL PROTEIN: 6.8 g/dL (ref 6.0–8.5)

## 2016-10-22 LAB — CBC WITH DIFFERENTIAL/PLATELET
BASOS ABS: 0 10*3/uL (ref 0.0–0.2)
Basos: 1 %
EOS (ABSOLUTE): 0.1 10*3/uL (ref 0.0–0.4)
Eos: 2 %
Hematocrit: 40.7 % (ref 34.0–46.6)
Hemoglobin: 13.6 g/dL (ref 11.1–15.9)
IMMATURE GRANS (ABS): 0 10*3/uL (ref 0.0–0.1)
IMMATURE GRANULOCYTES: 0 %
LYMPHS: 37 %
Lymphocytes Absolute: 1.3 10*3/uL (ref 0.7–3.1)
MCH: 30.2 pg (ref 26.6–33.0)
MCHC: 33.4 g/dL (ref 31.5–35.7)
MCV: 90 fL (ref 79–97)
MONOCYTES: 9 %
Monocytes Absolute: 0.3 10*3/uL (ref 0.1–0.9)
NEUTROS PCT: 51 %
Neutrophils Absolute: 1.8 10*3/uL (ref 1.4–7.0)
PLATELETS: 241 10*3/uL (ref 150–379)
RBC: 4.51 x10E6/uL (ref 3.77–5.28)
RDW: 13.1 % (ref 12.3–15.4)
WBC: 3.5 10*3/uL (ref 3.4–10.8)

## 2016-10-22 LAB — LIPID PANEL
CHOL/HDL RATIO: 2.4 ratio (ref 0.0–4.4)
Cholesterol, Total: 198 mg/dL (ref 100–199)
HDL: 83 mg/dL (ref >39)
LDL CALC: 98 mg/dL (ref 0–99)
TRIGLYCERIDES: 85 mg/dL (ref 0–149)
VLDL CHOLESTEROL CAL: 17 mg/dL (ref 5–40)

## 2016-11-18 ENCOUNTER — Other Ambulatory Visit: Payer: Self-pay | Admitting: Family Medicine

## 2016-12-03 ENCOUNTER — Encounter: Payer: Self-pay | Admitting: Family Medicine

## 2016-12-03 ENCOUNTER — Ambulatory Visit (INDEPENDENT_AMBULATORY_CARE_PROVIDER_SITE_OTHER): Payer: Medicare Other | Admitting: Family Medicine

## 2016-12-03 VITALS — BP 129/72 | HR 69 | Temp 99.0°F | Resp 16 | Ht 62.0 in | Wt 134.8 lb

## 2016-12-03 DIAGNOSIS — Z9989 Dependence on other enabling machines and devices: Secondary | ICD-10-CM

## 2016-12-03 DIAGNOSIS — R413 Other amnesia: Secondary | ICD-10-CM

## 2016-12-03 DIAGNOSIS — E7800 Pure hypercholesterolemia, unspecified: Secondary | ICD-10-CM

## 2016-12-03 DIAGNOSIS — F418 Other specified anxiety disorders: Secondary | ICD-10-CM | POA: Diagnosis not present

## 2016-12-03 DIAGNOSIS — R7309 Other abnormal glucose: Secondary | ICD-10-CM | POA: Diagnosis not present

## 2016-12-03 DIAGNOSIS — G4733 Obstructive sleep apnea (adult) (pediatric): Secondary | ICD-10-CM

## 2016-12-03 DIAGNOSIS — F32A Depression, unspecified: Secondary | ICD-10-CM

## 2016-12-03 DIAGNOSIS — F419 Anxiety disorder, unspecified: Secondary | ICD-10-CM

## 2016-12-03 DIAGNOSIS — F329 Major depressive disorder, single episode, unspecified: Secondary | ICD-10-CM

## 2016-12-03 NOTE — Progress Notes (Signed)
Subjective:    Patient ID: Olivia Marsh, female    DOB: [birth year], 66 y.o.   MRN: [id number]  12/03/2016  Follow-up (states at night she was feeling lonely and her medication Venlafaxine was increased to 225mg . Her first dose was this morning) and Anxiety (states everything else is going good; no concerns)   HPI This 66 y.o. female presents with aunt for evaluation of memory loss.  Referred for neuropsychiatric evaluation at last visit.  Added Lexapro 5mg  daily; decreased Effexor to 150mg  daily.  Increased Atorvastatin to 20mg  daily.   Went Friday or Monday to neuropsychiatrist; reviewed all medications.  Reported doing well until Venlafaxine decreased to 150mg  and Lexapro 5mg  daily.  Felt really lonely at night; does not cry but a different feeling.  Just went to him in past week.  He recommended returning to 225mg  daily. Bottle of Venlafaxine 225mg  one daily prescribed on 11/02/16 by Miguel Aschoff. Mother went with patient.   Follow-up appointment not scheduled.  Some days memory is great; then there are days when short term is horrible.    Christmas was a little hard; tension with daughter in law/Emily; chronic tension with daughter-in-law.  Gets along with daughter in law because has to.  Still keeping granddaughter twice weekly. Never forgets anything for her.  Remembered how to get here last visit.  Came with mother to appointment and wenet shopping; able to get along in [place].  Never got lost anywhere.  Rarely drives at night.  Will drive to son's house.  Lives at [facility] on [place].  Bible study 6-8 and is dark; drives at [place].  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  Downsized booth.  Upcoming appointment.  Sister will go to appointment with Manuella Ghazi.  Sister in [place]. Son in [place].  Mom in [place].  Corey/daughter in [place].  Daughter getting married in September 2018.  Wants to sell house to daughter.     Best friend and  aunt are reporting ongoing confusion and repetitive questioning.  Forgot to pick up mother before appointment with neuropsychiatrist last week after pt called mother to discuss appointment and riding together.     Immunization History  Administered Date(s) Administered  . Influenza,inj,Quad PF,36+ Mos 09/18/2014, 09/17/2015, 10/21/2016  . Pneumococcal Conjugate-13 05/14/2016  . Tdap 03/15/2014  . Zoster 11/04/2015   BP Readings from Last 3 Encounters:  12/03/16 129/72  10/21/16 118/66  05/14/16 122/80   Wt Readings from Last 3 Encounters:  12/03/16 134 lb 12.8 oz (61.1 kg)  10/21/16 136 lb (61.7 kg)  05/14/16 144 lb (65.3 kg)    2018; January, Monday; [facility]  [facility] [place] No ifs ands or buts  World; [place] Follows commands identifed 3 objects Clock correcct at [facility] 3/3 Read sentence and followed commands Copy of object.  29/30   Review of Systems  Constitutional: Negative for chills, diaphoresis, fatigue and fever.  Eyes: Negative for visual disturbance.  Respiratory: Negative for cough and shortness of breath.   Cardiovascular: Negative for chest pain, palpitations and leg swelling.  Gastrointestinal: Negative for abdominal pain, constipation, diarrhea, nausea and vomiting.  Endocrine: Negative for cold intolerance, heat intolerance, polydipsia, polyphagia and polyuria.  Neurological: Negative for dizziness, tremors, seizures, syncope, facial asymmetry, speech difficulty, weakness, light-headedness, numbness and headaches.  Psychiatric/Behavioral: Negative for agitation, confusion, decreased concentration, dysphoric mood, self-injury, sleep disturbance and suicidal ideas. The patient is not nervous/anxious.  Past Medical History:  Diagnosis Date  . Anemia   . Anxiety   . GAD (generalized anxiety disorder)   . GERD (gastroesophageal reflux disease)   . Heart murmur    history of  . Hyperlipidemia   . Hypertension    . Leukopenia   . MDD (major depressive disorder)   . Mild depression ([place])   . OCD (obsessive compulsive disorder)   . OSA on CPAP    Past Surgical History:  Procedure Laterality Date  . BREAST MASS EXCISION Right   . BREAST SURGERY    . COLONOSCOPY     [place].   No Known Allergies  Social History   Social History  . Marital status: Divorced    Spouse name: N/A  . Number of children: 2  . Years of education: N/A   Occupational History  . unemployed    Social History Main Topics  . Smoking status: Never Smoker  . Smokeless tobacco: Never Used  . Alcohol use Yes     Comment: occasional  . Drug use: No  . Sexual activity: Not Currently   Other Topics Concern  . Not on file   Social History Narrative   Martial status: married x 19 years; second marriage.  Does not live with husband.  [place].   Mother in [place]; really likes church in [place].      Children: 2 children (38, 31); 1 granddaughter in [place].        Lives: alone; husband lives at [employer].       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       Tobacco: never      Alcohol:  None       Exercise: some days (as of Health Survey on 03/15/2014)      Seatbelt: 100%      Guns: loaded and unsecured.     Family History  Problem Relation Age of Onset  . Hypertension Mother   . Lupus Mother   . Dementia Mother   . Heart disease Mother 49    aortic valve replacement  . Hypertension Father   . Heart disease Father     bypass  . Alcohol abuse Father   . Dementia Father   . Cancer Father     throat cancer; tobacco abuse.  . Arthritis Sister     Rheumatoid Arthritis       Objective:    BP 129/72   Pulse 69   Temp 99 F (37.2 C) (Oral)   Resp 16   Ht 5\' 2"  (1.575 m)   Wt 134 lb 12.8 oz (61.1 kg)   SpO2 95%   BMI 24.66 kg/m  Physical Exam  Constitutional: She is oriented to person, place, and time. She appears well-developed and well-nourished. No  distress.  HENT:  Head: Normocephalic and atraumatic.  Right Ear: External ear normal.  Left Ear: External ear normal.  Nose: Nose normal.  Mouth/Throat: Oropharynx is clear and moist.  Eyes: Conjunctivae and EOM are normal. Pupils are equal, round, and reactive to light.  Neck: Normal range of motion. Neck supple. Carotid bruit is not present. No thyromegaly present.  Cardiovascular: Normal rate, regular rhythm, normal heart sounds and intact distal pulses.  Exam reveals no gallop and no friction rub.   No murmur heard. Pulmonary/Chest: Effort normal and breath sounds normal. She has no wheezes. She has no rales.  Abdominal: Soft. Bowel sounds are normal. She exhibits no distension and  no mass. There is no tenderness. There is no rebound and no guarding.  Lymphadenopathy:    She has no cervical adenopathy.  Neurological: She is alert and oriented to person, place, and time. No cranial nerve deficit.  Skin: Skin is warm and dry. No rash noted. She is not diaphoretic. No erythema. No pallor.  Psychiatric: She has a normal mood and affect. Her behavior is normal. Judgment and thought content normal.    MMSE - Mini Mental State Exam 12/03/2016 10/21/2016  Orientation to time 4 5  Orientation to Place 5 5  Registration 3 3  Attention/ Calculation 5 5  Recall 3 3  Language- name 2 objects 2 2  Language- repeat 1 1  Language- follow 3 step command 3 3  Language- read & follow direction 1 1  Write a sentence 1 1  Copy design 1 1  Total score 29 30       Assessment & Plan:   1. Memory loss   2. Anxiety and depression   3. Pure hypercholesterolemia   4. Other abnormal glucose   5. OSA on CPAP    -s/p neuropsychiatric evaluation in past week; will await consultation note.   -has follow-up appointment with neurology next week. -Neuropsych increased Effexor to 225 and stopped Lexapro last week. -family and friends are reporting ongoing confusion and memory loss short-term.     -consider EEG in future if persists to rule out seizure disorder.    No orders of the defined types were placed in this encounter.  No orders of the defined types were placed in this encounter.   Return in about 3 months (around 03/02/2017) for recheck.   Jazline Cumbee Elayne Guerin, M.D. Primary [facility] at [facility] [address] [place], [place]  [address] [phone] phone [phone] fax

## 2016-12-03 NOTE — Patient Instructions (Signed)
     IF you received an x-ray today, you will receive an invoice from [facility]. Please contact [facility] at [phone] with questions or concerns regarding your invoice.   IF you received labwork today, you will receive an invoice from [facility]. Please contact [facility] at [phone] with questions or concerns regarding your invoice.   Our billing staff will not be able to assist you with questions regarding bills from these companies.  You will be contacted with the lab results as soon as they are available. The fastest way to get your results is to activate your My Chart account. Instructions are located on the last page of this paperwork. If you have not heard from us regarding the results in 2 weeks, please contact this office.     

## 2017-01-15 ENCOUNTER — Ambulatory Visit: Payer: Medicare Other | Attending: Neurology

## 2017-01-15 DIAGNOSIS — G4733 Obstructive sleep apnea (adult) (pediatric): Secondary | ICD-10-CM | POA: Insufficient documentation

## 2017-03-10 ENCOUNTER — Encounter: Payer: Self-pay | Admitting: Family Medicine

## 2017-03-10 ENCOUNTER — Ambulatory Visit (INDEPENDENT_AMBULATORY_CARE_PROVIDER_SITE_OTHER): Payer: Medicare Other | Admitting: Family Medicine

## 2017-03-10 VITALS — BP 140/80 | HR 65 | Temp 97.4°F | Resp 16 | Ht 62.0 in | Wt 136.2 lb

## 2017-03-10 DIAGNOSIS — E7800 Pure hypercholesterolemia, unspecified: Secondary | ICD-10-CM

## 2017-03-10 DIAGNOSIS — R7309 Other abnormal glucose: Secondary | ICD-10-CM | POA: Diagnosis not present

## 2017-03-10 DIAGNOSIS — F419 Anxiety disorder, unspecified: Secondary | ICD-10-CM

## 2017-03-10 DIAGNOSIS — G4733 Obstructive sleep apnea (adult) (pediatric): Secondary | ICD-10-CM | POA: Diagnosis not present

## 2017-03-10 DIAGNOSIS — Z9989 Dependence on other enabling machines and devices: Secondary | ICD-10-CM | POA: Diagnosis not present

## 2017-03-10 DIAGNOSIS — R030 Elevated blood-pressure reading, without diagnosis of hypertension: Secondary | ICD-10-CM | POA: Diagnosis not present

## 2017-03-10 DIAGNOSIS — R413 Other amnesia: Secondary | ICD-10-CM

## 2017-03-10 DIAGNOSIS — F329 Major depressive disorder, single episode, unspecified: Secondary | ICD-10-CM

## 2017-03-10 NOTE — Progress Notes (Signed)
Subjective:    Patient ID: Olivia Marsh, female    DOB: 04-Jul-1951, 66 y.o.   MRN: [id number]  03/10/2017  Follow-up (anxiety)   HPI This 66 y.o. female presents for evaluation of anxiety/depression and memory loss.  s/p follow-up with Shah/neurology who recommended CPAP for uncontrolled OSA; felt suffering with mild Alzheimers with vascular dementia.  Recommended continuing Aricept.  Referred for repeat sleep study; he has prescribed another CPAP machine; needs to schedule appointment to receive CPAP; to call today.  [place].  Thinks getting too old to be down there for two weeks.  Feels more comfortable at home.  Had a really good time.  Still keeping granddaughter two days per week; picks up at 11:30am and keeps until 5:30-6:00pm.  Wednesday morning church study group.  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  Stays busy.  Gets lonely a lot.  Must write everything down; keeps notes.  Has not forgotten any appointments.  Mother came with patient today.  Non fasting today; ate unhealthy; banana nut walnut muffin.  English whole wheat, egg white, colby jack cheese with mustard.    Venlafaxine 225mg  one daily. Dr. Manuella Ghazi prescribd Vayacog capsules for memory. Manuella Ghazi has increased Aricept to two daily for 20 mg daily per patient.  43 year old mother is getting feable; may need to move in with patient.  Wt Readings from Last 3 Encounters:  03/10/17 136 lb 3.2 oz (61.8 kg)  12/03/16 134 lb 12.8 oz (61.1 kg)  10/21/16 136 lb (61.7 kg)   BP Readings from Last 3 Encounters:  03/10/17 140/80  12/03/16 129/72  10/21/16 118/66   Immunization History  Administered Date(s) Administered  . Influenza,inj,Quad PF,36+ Mos 09/18/2014, 09/17/2015, 10/21/2016  . Pneumococcal Conjugate-13 05/14/2016  . Tdap 03/15/2014  . Zoster 11/04/2015    Review of Systems  Constitutional: Negative for chills, diaphoresis, fatigue and fever.  Eyes: Negative for visual disturbance.    Respiratory: Negative for cough and shortness of breath.   Cardiovascular: Negative for chest pain, palpitations and leg swelling.  Gastrointestinal: Negative for abdominal pain, constipation, diarrhea, nausea and vomiting.  Endocrine: Negative for cold intolerance, heat intolerance, polydipsia, polyphagia and polyuria.  Neurological: Negative for dizziness, tremors, seizures, syncope, facial asymmetry, speech difficulty, weakness, light-headedness, numbness and headaches.    Past Medical History:  Diagnosis Date  . Anemia   . Anxiety   . GAD (generalized anxiety disorder)   . GERD (gastroesophageal reflux disease)   . Heart murmur    history of  . Hyperlipidemia   . Hypertension   . Leukopenia   . MDD (major depressive disorder)   . Mild depression ([place])   . OCD (obsessive compulsive disorder)   . OSA on CPAP    Past Surgical History:  Procedure Laterality Date  . BREAST MASS EXCISION Right   . BREAST SURGERY    . COLONOSCOPY     [place].   No Known Allergies  Social History   Social History  . Marital status: Divorced    Spouse name: N/A  . Number of children: 2  . Years of education: N/A   Occupational History  . unemployed    Social History Main Topics  . Smoking status: Never Smoker  . Smokeless tobacco: Never Used  . Alcohol use Yes     Comment: occasional  . Drug use: No  . Sexual activity: Not Currently   Other Topics Concern  . Not on file  Social History Narrative   Martial status: married x 19 years; second marriage.  Does not live with husband.  [place] the beach.   Mother in [place]; really likes church in [place].      Children: 2 children (38, 31); 1 granddaughter in [place].        Lives: alone; husband lives at [employer].       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       Tobacco: never      Alcohol:  None       Exercise: some days (as of Health Survey on 03/15/2014)      Seatbelt: 100%      Guns: loaded  and unsecured.     Family History  Problem Relation Age of Onset  . Hypertension Mother   . Lupus Mother   . Dementia Mother   . Heart disease Mother 15       aortic valve replacement  . Hypertension Father   . Heart disease Father        bypass  . Alcohol abuse Father   . Dementia Father   . Cancer Father        throat cancer; tobacco abuse.  . Arthritis Sister        Rheumatoid Arthritis       Objective:    BP 140/80   Pulse 65   Temp 97.4 F (36.3 C) (Oral)   Resp 16   Ht 5\' 2"  (1.575 m)   Wt 136 lb 3.2 oz (61.8 kg)   SpO2 98%   BMI 24.91 kg/m  Physical Exam  Constitutional: She is oriented to person, place, and time. She appears well-developed and well-nourished. No distress.  HENT:  Head: Normocephalic and atraumatic.  Right Ear: External ear normal.  Left Ear: External ear normal.  Nose: Nose normal.  Mouth/Throat: Oropharynx is clear and moist.  Eyes: Conjunctivae and EOM are normal. Pupils are equal, round, and reactive to light.  Neck: Normal range of motion. Neck supple. Carotid bruit is not present. No thyromegaly present.  Cardiovascular: Normal rate, regular rhythm, normal heart sounds and intact distal pulses.  Exam reveals no gallop and no friction rub.   No murmur heard. Pulmonary/Chest: Effort normal and breath sounds normal. She has no wheezes. She has no rales.  Abdominal: Soft. Bowel sounds are normal. She exhibits no distension and no mass. There is no tenderness. There is no rebound and no guarding.  Lymphadenopathy:    She has no cervical adenopathy.  Neurological: She is alert and oriented to person, place, and time. No cranial nerve deficit. She exhibits normal muscle tone. Coordination normal.  Skin: Skin is warm and dry. No rash noted. She is not diaphoretic. No erythema. No pallor.  Psychiatric: She has a normal mood and affect. Her behavior is normal. Judgment and thought content normal.   Depression screen [facility] 2/9 03/10/2017 12/03/2016  10/21/2016 05/14/2016 09/17/2015  Decreased Interest 0 0 0 0 0  Down, Depressed, Hopeless 0 0 0 0 0  PHQ - 2 Score 0 0 0 0 0   MMSE - Mini Mental State Exam 03/10/2017 12/03/2016 10/21/2016  Orientation to time 5 4 5   Orientation to Place 5 5 5   Registration 3 3 3   Attention/ Calculation 5 5 5   Recall 3 3 3   Language- name 2 objects 2 2 2   Language- repeat 1 1 1   Language- follow 3 step command 3 3 3   Language- read &  follow direction 1 1 1   Write a sentence 1 1 1   Copy design 1 1 1   Total score 30 29 30         Assessment & Plan:   1. Anxiety and depression   2. Pure hypercholesterolemia   3. Other abnormal glucose   4. OSA on CPAP   5. Memory loss   6. Blood pressure elevated without history of HTN    -stable at this time; obtain labs at next visit.  No changes to medications today.  -followed by neurology closely for memory loss; tolerating Aricept; clarify dose -- 10mg  versus 20mg .  MMSE 30/30 today. -blood pressure borderline today; monitor closely at each visit.   Orders Placed This Encounter  Procedures  . Care order/instruction:    Scheduling Instructions:     Perform mini mental status exam  . Care order/instruction:    Please recheck BP.   No orders of the defined types were placed in this encounter.   Return in about 3 months (around 06/10/2017) for complete physical examiniation.   Chelsy Parrales Elayne Guerin, M.D. Primary Care at [facility] [address] [place], [place]  [address] [phone] phone [phone] fax

## 2017-03-10 NOTE — Patient Instructions (Signed)
     IF you received an x-ray today, you will receive an invoice from [facility]. Please contact [facility] at [phone] with questions or concerns regarding your invoice.   IF you received labwork today, you will receive an invoice from [facility]. Please contact [facility] at [phone] with questions or concerns regarding your invoice.   Our billing staff will not be able to assist you with questions regarding bills from these companies.  You will be contacted with the lab results as soon as they are available. The fastest way to get your results is to activate your My Chart account. Instructions are located on the last page of this paperwork. If you have not heard from us regarding the results in 2 weeks, please contact this office.     

## 2017-04-05 ENCOUNTER — Other Ambulatory Visit: Payer: Self-pay | Admitting: Family Medicine

## 2017-04-06 NOTE — Telephone Encounter (Signed)
Came to [place] but sees Dr Brendia Sacks

## 2017-04-17 ENCOUNTER — Other Ambulatory Visit: Payer: Self-pay | Admitting: Family Medicine

## 2017-06-23 ENCOUNTER — Ambulatory Visit (INDEPENDENT_AMBULATORY_CARE_PROVIDER_SITE_OTHER): Payer: Medicare Other | Admitting: Family Medicine

## 2017-06-23 ENCOUNTER — Encounter: Payer: Self-pay | Admitting: Family Medicine

## 2017-06-23 VITALS — BP 132/72 | HR 63 | Temp 97.9°F | Resp 16 | Ht 63.78 in | Wt 137.0 lb

## 2017-06-23 DIAGNOSIS — Z1231 Encounter for screening mammogram for malignant neoplasm of breast: Secondary | ICD-10-CM

## 2017-06-23 DIAGNOSIS — Z23 Encounter for immunization: Secondary | ICD-10-CM

## 2017-06-23 DIAGNOSIS — Z0000 Encounter for general adult medical examination without abnormal findings: Secondary | ICD-10-CM | POA: Diagnosis not present

## 2017-06-23 MED ORDER — ZOSTER VAC RECOMB ADJUVANTED 50 MCG/0.5ML IM SUSR: 0.5000 mL | Freq: Once | INTRAMUSCULAR | 1 refills | Status: AC

## 2017-06-23 NOTE — Patient Instructions (Addendum)
RETURN IN THREE MONTHS FASTING.  IF you received an x-ray today, you will receive an invoice from [facility]. Please contact [facility] at [phone] with questions or concerns regarding your invoice.   IF you received labwork today, you will receive an invoice from [facility]. Please contact [facility] at [phone] with questions or concerns regarding your invoice.   Our billing staff will not be able to assist you with questions regarding bills from these companies.  You will be contacted with the lab results as soon as they are available. The fastest way to get your results is to activate your My Chart account. Instructions are located on the last page of this paperwork. If you have not heard from [place] regarding the results in 2 weeks, please contact this office.      Preventive Care 66 Years and Older, Female Preventive care refers to lifestyle choices and visits with your health care provider that can promote health and wellness. What does preventive care include?  A yearly physical exam. This is also called an annual well check.  Dental exams once or twice a year.  Routine eye exams. Ask your health care provider how often you should have your eyes checked.  Personal lifestyle choices, including: ? Daily care of your teeth and gums. ? Regular physical activity. ? Eating a healthy diet. ? Avoiding tobacco and drug use. ? Limiting alcohol use. ? Practicing safe sex. ? Taking low-dose aspirin every day. ? Taking vitamin and mineral supplements as recommended by your health care provider. What happens during an annual well check? The services and screenings done by your health care provider during your annual well check will depend on your age, overall health, lifestyle risk factors, and family history of disease. Counseling Your health care provider may ask you questions about your:  Alcohol use.  Tobacco use.  Drug use.  Emotional  well-being.  Home and relationship well-being.  Sexual activity.  Eating habits.  History of falls.  Memory and ability to understand (cognition).  Work and work Statistician.  Reproductive health.  Screening You may have the following tests or measurements:  Height, weight, and BMI.  Blood pressure.  Lipid and cholesterol levels. These may be checked every 5 years, or more frequently if you are over 57 years old.  Skin check.  Lung cancer screening. You may have this screening every year starting at age 78 if you have a 30-pack-year history of smoking and currently smoke or have quit within the past 15 years.  Fecal occult blood test (FOBT) of the stool. You may have this test every year starting at age 52.  Flexible sigmoidoscopy or colonoscopy. You may have a sigmoidoscopy every 5 years or a colonoscopy every 10 years starting at age 48.  Hepatitis C blood test.  Hepatitis B blood test.  Sexually transmitted disease (STD) testing.  Diabetes screening. This is done by checking your blood sugar (glucose) after you have not eaten for a while (fasting). You may have this done every 1-3 years.  Bone density scan. This is done to screen for osteoporosis. You may have this done starting at age 10.  Mammogram. This may be done every 1-2 years. Talk to your health care provider about how often you should have regular mammograms.  Talk with your health care provider about your test results, treatment options, and if necessary, the need for more tests. Vaccines Your health care provider may recommend certain vaccines, such as:  Influenza vaccine. This is  recommended every year.  Tetanus, diphtheria, and acellular pertussis (Tdap, Td) vaccine. You may need a Td booster every 10 years.  Varicella vaccine. You may need this if you have not been vaccinated.  Zoster vaccine. You may need this after age 25.  Measles, mumps, and rubella (MMR) vaccine. You may need at least  one dose of MMR if you were born in 1957 or later. You may also need a second dose.  Pneumococcal 13-valent conjugate (PCV13) vaccine. One dose is recommended after age 19.  Pneumococcal polysaccharide (PPSV23) vaccine. One dose is recommended after age 28.  Meningococcal vaccine. You may need this if you have certain conditions.  Hepatitis A vaccine. You may need this if you have certain conditions or if you travel or work in places where you may be exposed to hepatitis A.  Hepatitis B vaccine. You may need this if you have certain conditions or if you travel or work in places where you may be exposed to hepatitis B.  Haemophilus influenzae type b (Hib) vaccine. You may need this if you have certain conditions.  Talk to your health care provider about which screenings and vaccines you need and how often you need them. This information is not intended to replace advice given to you by your health care provider. Make sure you discuss any questions you have with your health care provider. Document Released: 11/16/2015 Document Revised: 07/09/2016 Document Reviewed: 08/21/2015 Elsevier Interactive Patient Education  2017 [employer].

## 2017-06-23 NOTE — Progress Notes (Signed)
Subjective:    Patient ID: Olivia Marsh, female    DOB: August 28, 1951, 66 y.o.   MRN: [id number]  06/23/2017  Annual Exam   HPI This 66 y.o. female presents for Annual Wellness Examination Initial.  Last physical:  05-14-16 Pap smear:  05-14-16 WNL Mammogram:  05-2016 Colonoscopy:  2013; WNL; [facility].   Bone density:  05-2016; mild osteopenia.   Eye exam: 2017; due now; glasses. Dental exam:  Every year.    Daughter getting married in September 2018.  Storing everything at patient's house.   Anxiety and depression: Patient reports good compliance with medication, good tolerance to medication, and good symptom control.  Gets lonely living alone yet mother visits patient a lot.  Also stays very busy with grandchildren. 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  Denies sadness or feeling overwhelmed.  Hypercholesterolemia: Patient reports good compliance with medication, good tolerance to medication, and good symptom control.    OSA: s/p sleep study with Dr. Manuella Ghazi.  Recent visit with Dr. Manuella Ghazi on 06/06/17; Patient reports good compliance with medication, good tolerance to medication, and good symptom control.     Cognitive impairment: s/p follow-up with neurology on 06/03/17.  Continue Aricept 10mg  daily; also recommend meditation to help with frustration around memory loss.  Recommended [email or web address].   BP Readings from Last 3 Encounters:  06/23/17 132/72  03/10/17 140/80  12/03/16 129/72   Wt Readings from Last 3 Encounters:  06/23/17 137 lb (62.1 kg)  03/10/17 136 lb 3.2 oz (61.8 kg)  12/03/16 134 lb 12.8 oz (61.1 kg)   Immunization History  Administered Date(s) Administered  . Influenza,inj,Quad PF,6+ Mos 09/18/2014, 09/17/2015, 10/21/2016  . Pneumococcal Conjugate-13 05/14/2016  . Tdap 03/15/2014  . Zoster 11/04/2015    Review of Systems  Constitutional: Negative for activity change, appetite change, chills, diaphoresis, fatigue, fever and unexpected weight change.   HENT: Negative for congestion, dental problem, drooling, ear discharge, ear pain, facial swelling, hearing loss, mouth sores, nosebleeds, postnasal drip, rhinorrhea, sinus pressure, sneezing, sore throat, tinnitus, trouble swallowing and voice change.   Eyes: Negative for photophobia, pain, discharge, redness, itching and visual disturbance.  Respiratory: Negative for apnea, cough, choking, chest tightness, shortness of breath, wheezing and stridor.   Cardiovascular: Negative for chest pain, palpitations and leg swelling.  Gastrointestinal: Negative for abdominal distention, abdominal pain, anal bleeding, blood in stool, constipation, diarrhea, nausea, rectal pain and vomiting.  Endocrine: Negative for cold intolerance, heat intolerance, polydipsia, polyphagia and polyuria.  Genitourinary: Negative for decreased urine volume, difficulty urinating, dyspareunia, dysuria, enuresis, flank pain, frequency, genital sores, hematuria, menstrual problem, pelvic pain, urgency, vaginal bleeding, vaginal discharge and vaginal pain.       Nocturia x 0.  Urinary leakage YES; wears a pad; can wear a pad all day.   Musculoskeletal: Negative for arthralgias, back pain, gait problem, joint swelling, myalgias, neck pain and neck stiffness.  Skin: Negative for color change, pallor, rash and wound.  Allergic/Immunologic: Negative for environmental allergies, food allergies and immunocompromised state.  Neurological: Negative for dizziness, tremors, seizures, syncope, facial asymmetry, speech difficulty, weakness, light-headedness, numbness and headaches.  Hematological: Negative for adenopathy. Does not bruise/bleed easily.  Psychiatric/Behavioral: Negative for agitation, behavioral problems, confusion, decreased concentration, dysphoric mood, hallucinations, self-injury, sleep disturbance and suicidal ideas. The patient is not nervous/anxious and is not hyperactive.        Bedtime 1100-0200; wakes up at 700.    Past  Medical History:  Diagnosis Date  . Anemia   . Anxiety   .  GAD (generalized anxiety disorder)   . GERD (gastroesophageal reflux disease)   . Heart murmur    history of  . Hyperlipidemia   . Hypertension   . Leukopenia   . MDD (major depressive disorder)   . Mild depression ([place])   . OCD (obsessive compulsive disorder)   . OSA on CPAP    Past Surgical History:  Procedure Laterality Date  . BREAST MASS EXCISION Right   . BREAST SURGERY    . COLONOSCOPY     [facility].   No Known Allergies Current Outpatient Prescriptions  Medication Sig Dispense Refill  . Ascorbic Acid (VITAMIN C PO) Take by mouth daily. Reported on 05/14/2016    . aspirin 81 MG chewable tablet Chew by mouth daily.    Marland Kitchen atorvastatin (LIPITOR) 20 MG tablet TAKE 1 TABLET (20 MG TOTAL) BY MOUTH DAILY AT 6 PM. 90 tablet 1  . Cholecalciferol (VITAMIN D3) 1000 UNITS CAPS Take by mouth daily. Reported on 05/14/2016    . donepezil (ARICEPT) 10 MG tablet TAKE 1/2 TABLET AT BEDTIME FOR 30 DAYS THEN INCREASE TO 1 TABLET AT BEDTIME  6  . escitalopram (LEXAPRO) 5 MG tablet TAKE 1 TABLET (5 MG TOTAL) BY MOUTH DAILY. 90 tablet 1  . Multiple Vitamins-Minerals (CENTRUM SILVER ULTRA WOMENS) TABS Take by mouth daily.    . Phosphatidylserine-DHA-EPA (VAYACOG PO) Take by mouth.    . Venlafaxine HCl 225 MG TB24 Take 1 tablet (225 mg total) by mouth daily. 90 tablet 1  . Zoster Vac Recomb Adjuvanted [facility]) injection Inject 0.5 mLs into the muscle once. 0.5 mL 1   No current facility-administered medications for this visit.    Social History   Social History  . Marital status: Divorced    Spouse name: N/A  . Number of children: 2  . Years of education: N/A   Occupational History  . unemployed    Social History Main Topics  . Smoking status: Never Smoker  . Smokeless tobacco: Never Used  . Alcohol use Yes     Comment: occasional  . Drug use: No  . Sexual activity: Not Currently    Birth control/ protection:  Post-menopausal   Other Topics Concern  . Not on file   Social History Narrative   Martial status: married x 20 years; second marriage.  Does not live with husband.  [place].   Mother in [place]; really likes church in [place].      Children: 2 children (40yo son, 5 daughter); 1 granddaughter (87 yo) in [place].        Lives: alone in [place]; husband lives at [place].       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      Tobacco: never      Alcohol:  None       Exercise: no formal exercise; stays busy all day.      Seatbelt: 100%; no texting      Guns: loaded and unsecured.        Advanced Directives: HCPOA: Logan Sharp/son.  FULL CODE but no prolonged measures.     Family History  Problem Relation Age of Onset  . Hypertension Mother   . Lupus Mother   . Dementia Mother   . Heart disease Mother 46       aortic valve replacement  . Hypertension Father   . Heart disease Father 62       CABG/CAD; smoker and drinker  .  Alcohol abuse Father   . Dementia Father   . Cancer Father        throat cancer; tobacco abuse.  . Arthritis Sister        Rheumatoid Arthritis       Objective:    BP 132/72   Pulse 63   Temp 97.9 F (36.6 C) (Oral)   Resp 16   Ht 5' 3.78" (1.62 m)   Wt 137 lb (62.1 kg)   SpO2 98%   BMI 23.68 kg/m  Physical Exam  Constitutional: She is oriented to person, place, and time. She appears well-developed and well-nourished. No distress.  HENT:  Head: Normocephalic and atraumatic.  Right Ear: External ear normal.  Left Ear: External ear normal.  Nose: Nose normal.  Mouth/Throat: Oropharynx is clear and moist.  Eyes: Pupils are equal, round, and reactive to light. Conjunctivae and EOM are normal.  Neck: Normal range of motion and full passive range of motion without pain. Neck supple. No JVD present. Carotid bruit is not present. No thyromegaly present.  Cardiovascular: Normal rate, regular  rhythm and normal heart sounds.  Exam reveals no gallop and no friction rub.   No murmur heard. Pulmonary/Chest: Effort normal and breath sounds normal. She has no wheezes. She has no rales.  Abdominal: Soft. Bowel sounds are normal. She exhibits no distension and no mass. There is no tenderness. There is no rebound and no guarding.  Musculoskeletal:       Right shoulder: Normal.       Left shoulder: Normal.       Cervical back: Normal.  Lymphadenopathy:    She has no cervical adenopathy.  Neurological: She is alert and oriented to person, place, and time. She has normal reflexes. No cranial nerve deficit. She exhibits normal muscle tone. Coordination normal.  Skin: Skin is warm and dry. No rash noted. She is not diaphoretic. No erythema. No pallor.  Psychiatric: She has a normal mood and affect. Her behavior is normal. Judgment and thought content normal.  Nursing note and vitals reviewed.   No results found. Depression screen [facility] 2/9 06/23/2017 03/10/2017 12/03/2016 10/21/2016 05/14/2016  Decreased Interest 0 0 0 0 0  Down, Depressed, Hopeless 0 0 0 0 0  PHQ - 2 Score 0 0 0 0 0   Fall Risk  06/23/2017 03/10/2017 10/21/2016 05/14/2016 09/17/2015  Falls in the past year? No No No No No    Functional Status Survey: Is the patient deaf or have difficulty hearing?: No Does the patient have difficulty seeing, even when wearing glasses/contacts?: No Does the patient have difficulty concentrating, remembering, or making decisions?: No Does the patient have difficulty walking or climbing stairs?: No Does the patient have difficulty dressing or bathing?: No Does the patient have difficulty doing errands alone such as visiting a doctor's office or shopping?: No     Assessment & Plan:   1. Encounter for Medicare annual wellness exam   2. Routine physical examination   3. Need for prophylactic vaccination against Streptococcus pneumoniae (pneumococcus)   4. Need for shingles vaccine   5.  Encounter for screening mammogram for breast cancer    -anticipatory guidance provided --- exercise, weight loss, safe driving practices, aspirin 81mg  daily. -obtain age appropriate screening labs and labs for chronic disease management. -moderate fall risk; undergoing treatment for depression; no evidence of hearing loss.  Discussed advanced directives and living will; also discussed end of life issues including code status.  -s/p Pneumovax -rx for Shingrix  provided -mammogram ordered.   Orders Placed This Encounter  Procedures  . MM DIGITAL SCREENING BILATERAL    Standing Status:   Future    Standing Expiration Date:   08/23/2018    Order Specific Question:   Reason for Exam (SYMPTOM  OR DIAGNOSIS REQUIRED)    Answer:   annual screening    Order Specific Question:   Preferred imaging location?    Answer:   [facility]  . Pneumococcal polysaccharide vaccine 23-valent greater than or equal to 2yo subcutaneous/IM   Meds ordered this encounter  Medications  . Zoster Vac Recomb Adjuvanted [facility]) injection    Sig: Inject 0.5 mLs into the muscle once.    Dispense:  0.5 mL    Refill:  1    Return in about 3 months (around 09/23/2017) for recheck CHOLESTEROL (BE FASTING).   Mykiah Schmuck Elayne Guerin, M.D. Primary Care at [facility] previously [facility] [place] [address] [place], [place]  [address] [phone] phone [phone] fax

## 2017-07-06 NOTE — Progress Notes (Signed)
Subjective:    Patient ID: Olivia Marsh, female    DOB: [birth year], 66 y.o.   MRN: [id number]  06/23/2017  Annual Exam   HPI This 66 y.o. female presents for Complete Physical Examination.  Health Maintenance  Topic Date Due  . COLONOSCOPY  04/26/2001  . INFLUENZA VACCINE  06/03/2017  . MAMMOGRAM  05/30/2018  . TETANUS/TDAP  03/15/2024  . DEXA SCAN  Completed  . Hepatitis C Screening  Completed  . PNA vac Low Risk Adult  Completed    BP Readings from Last 3 Encounters:  06/23/17 132/72  03/10/17 140/80  12/03/16 129/72   Wt Readings from Last 3 Encounters:  06/23/17 137 lb (62.1 kg)  03/10/17 136 lb 3.2 oz (61.8 kg)  12/03/16 134 lb 12.8 oz (61.1 kg)   Immunization History  Administered Date(s) Administered  . Influenza,inj,Quad PF,6+ Mos 09/18/2014, 09/17/2015, 10/21/2016  . Pneumococcal Conjugate-13 05/14/2016  . Pneumococcal Polysaccharide-23 06/23/2017  . Tdap 03/15/2014  . Zoster 11/04/2015    Review of Systems  Constitutional: Negative for activity change, appetite change, chills, diaphoresis, fatigue, fever and unexpected weight change.  HENT: Negative for congestion, dental problem, drooling, ear discharge, ear pain, facial swelling, hearing loss, mouth sores, nosebleeds, postnasal drip, rhinorrhea, sinus pressure, sneezing, sore throat, tinnitus, trouble swallowing and voice change.   Eyes: Negative for photophobia, pain, discharge, redness, itching and visual disturbance.  Respiratory: Negative for apnea, cough, choking, chest tightness, shortness of breath, wheezing and stridor.   Cardiovascular: Negative for chest pain, palpitations and leg swelling.  Gastrointestinal: Negative for abdominal distention, abdominal pain, anal bleeding, blood in stool, constipation, diarrhea, nausea, rectal pain and vomiting.  Endocrine: Negative for cold intolerance, heat intolerance, polydipsia, polyphagia and polyuria.  Genitourinary: Negative for decreased urine  volume, difficulty urinating, dyspareunia, dysuria, enuresis, flank pain, frequency, genital sores, hematuria, menstrual problem, pelvic pain, urgency, vaginal bleeding, vaginal discharge and vaginal pain.  Musculoskeletal: Negative for arthralgias, back pain, gait problem, joint swelling, myalgias, neck pain and neck stiffness.  Skin: Negative for color change, pallor, rash and wound.  Allergic/Immunologic: Negative for environmental allergies, food allergies and immunocompromised state.  Neurological: Negative for dizziness, tremors, seizures, syncope, facial asymmetry, speech difficulty, weakness, light-headedness, numbness and headaches.  Hematological: Negative for adenopathy. Does not bruise/bleed easily.  Psychiatric/Behavioral: Negative for agitation, behavioral problems, confusion, decreased concentration, dysphoric mood, hallucinations, self-injury, sleep disturbance and suicidal ideas. The patient is not nervous/anxious and is not hyperactive.     Past Medical History:  Diagnosis Date  . Anemia   . Anxiety   . GAD (generalized anxiety disorder)   . GERD (gastroesophageal reflux disease)   . Heart murmur    history of  . Hyperlipidemia   . Hypertension   . Leukopenia   . MDD (major depressive disorder)   . Mild depression ([place])   . OCD (obsessive compulsive disorder)   . OSA on CPAP    Past Surgical History:  Procedure Laterality Date  . BREAST MASS EXCISION Right   . BREAST SURGERY    . COLONOSCOPY     [facility].   No Known Allergies Current Outpatient Prescriptions  Medication Sig Dispense Refill  . Ascorbic Acid (VITAMIN C PO) Take by mouth daily. Reported on 05/14/2016    . aspirin 81 MG chewable tablet Chew by mouth daily.    Marland Kitchen atorvastatin (LIPITOR) 20 MG tablet TAKE 1 TABLET (20 MG TOTAL) BY MOUTH DAILY AT 6 PM. 90 tablet 1  . Cholecalciferol (VITAMIN D3) 1000 UNITS  CAPS Take by mouth daily. Reported on 05/14/2016    . donepezil (ARICEPT) 10 MG tablet TAKE 1/2  TABLET AT BEDTIME FOR 30 DAYS THEN INCREASE TO 1 TABLET AT BEDTIME  6  . escitalopram (LEXAPRO) 5 MG tablet TAKE 1 TABLET (5 MG TOTAL) BY MOUTH DAILY. 90 tablet 1  . Multiple Vitamins-Minerals (CENTRUM SILVER ULTRA WOMENS) TABS Take by mouth daily.    . Phosphatidylserine-DHA-EPA (VAYACOG PO) Take by mouth.    . Venlafaxine HCl 225 MG TB24 Take 1 tablet (225 mg total) by mouth daily. 90 tablet 1   No current facility-administered medications for this visit.    Social History   Social History  . Marital status: Divorced    Spouse name: N/A  . Number of children: 2  . Years of education: N/A   Occupational History  . unemployed    Social History Main Topics  . Smoking status: Never Smoker  . Smokeless tobacco: Never Used  . Alcohol use Yes     Comment: occasional  . Drug use: No  . Sexual activity: Not Currently    Birth control/ protection: Post-menopausal   Other Topics Concern  . Not on file   Social History Narrative   Martial status: married x 20 years; second marriage.  Does not live with husband.  [place].   Mother in [place]; really likes church in [place].      Children: 2 children (79yo son, 90 daughter); 1 granddaughter (58 yo) in [place].        Lives: alone in [place]; husband lives at [place].       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      Tobacco: never      Alcohol:  None       Exercise: no formal exercise; stays busy all day.      Seatbelt: 100%; no texting      Guns: loaded and unsecured.        Advanced Directives: HCPOA: Logan Sharp/son.  FULL CODE but no prolonged measures.     Family History  Problem Relation Age of Onset  . Hypertension Mother   . Lupus Mother   . Dementia Mother   . Heart disease Mother 69       aortic valve replacement  . Hypertension Father   . Heart disease Father 69       CABG/CAD; smoker and drinker  . Alcohol abuse Father   . Dementia Father   .  Cancer Father        throat cancer; tobacco abuse.  . Arthritis Sister        Rheumatoid Arthritis       Objective:    BP 132/72   Pulse 63   Temp 97.9 F (36.6 C) (Oral)   Resp 16   Ht 5' 3.78" (1.62 m)   Wt 137 lb (62.1 kg)   SpO2 98%   BMI 23.68 kg/m  Physical Exam  Constitutional: She is oriented to person, place, and time. She appears well-developed and well-nourished. No distress.  HENT:  Head: Normocephalic and atraumatic.  Right Ear: External ear normal.  Left Ear: External ear normal.  Nose: Nose normal.  Mouth/Throat: Oropharynx is clear and moist.  Eyes: Pupils are equal, round, and reactive to light. Conjunctivae and EOM are normal.  Neck: Normal range of motion and full passive range of motion without pain. Neck supple. No JVD present. Carotid bruit is not present.  No thyromegaly present.  Cardiovascular: Normal rate, regular rhythm and normal heart sounds.  Exam reveals no gallop and no friction rub.   No murmur heard. Pulmonary/Chest: Effort normal and breath sounds normal. She has no wheezes. She has no rales. Right breast exhibits no inverted nipple, no mass, no nipple discharge, no skin change and no tenderness. Left breast exhibits no inverted nipple, no mass, no nipple discharge, no skin change and no tenderness. Breasts are symmetrical.  Abdominal: Soft. Bowel sounds are normal. She exhibits no distension and no mass. There is no tenderness. There is no rebound and no guarding.  Musculoskeletal:       Right shoulder: Normal.       Left shoulder: Normal.       Cervical back: Normal.  Lymphadenopathy:    She has no cervical adenopathy.  Neurological: She is alert and oriented to person, place, and time. She has normal reflexes. No cranial nerve deficit. She exhibits normal muscle tone. Coordination normal.  Skin: Skin is warm and dry. No rash noted. She is not diaphoretic. No erythema. No pallor.  Psychiatric: She has a normal mood and affect. Her behavior  is normal. Judgment and thought content normal.  Nursing note and vitals reviewed.       Assessment & Plan:   1. Encounter for Medicare annual wellness exam   2. Routine physical examination   3. Need for prophylactic vaccination against Streptococcus pneumoniae (pneumococcus)   4. Need for shingles vaccine   5. Encounter for screening mammogram for breast cancer    -anticipatory guidance provided --- exercise, weight loss, safe driving practices, aspirin 81mg  daily. -obtain age appropriate screening labs and labs for chronic disease management.    Orders Placed This Encounter  Procedures  . MM DIGITAL SCREENING BILATERAL    Standing Status:   Future    Standing Expiration Date:   08/23/2018    Order Specific Question:   Reason for Exam (SYMPTOM  OR DIAGNOSIS REQUIRED)    Answer:   annual screening    Order Specific Question:   Preferred imaging location?    Answer:   [facility]  . Pneumococcal polysaccharide vaccine 23-valent greater than or equal to 2yo subcutaneous/IM   Meds ordered this encounter  Medications  . Zoster Vac Recomb Adjuvanted [facility]) injection    Sig: Inject 0.5 mLs into the muscle once.    Dispense:  0.5 mL    Refill:  1    Return in about 3 months (around 09/23/2017) for recheck CHOLESTEROL (BE FASTING).   Alric Geise Elayne Guerin, M.D. Primary Care at [facility] previously Ur[facility] [place] [address] [place], [place]  [address] [phone] phone [phone] fax

## 2017-08-04 ENCOUNTER — Other Ambulatory Visit: Payer: Self-pay | Admitting: Family Medicine

## 2017-08-04 NOTE — Telephone Encounter (Signed)
Refill request for atorvastatin 20 mg denied, refilled on 04/17/17 for 90 day supply with 1 refill.  Refill not appropriate. Dgaddy, CMA

## 2017-08-07 ENCOUNTER — Ambulatory Visit
Admission: RE | Admit: 2017-08-07 | Discharge: 2017-08-07 | Disposition: A | Discharge status: Home / Self Care | Payer: Medicare Other | Source: Ambulatory Visit | Attending: Family Medicine | Admitting: Family Medicine

## 2017-08-07 DIAGNOSIS — Z1231 Encounter for screening mammogram for malignant neoplasm of breast: Secondary | ICD-10-CM

## 2017-09-30 ENCOUNTER — Encounter: Payer: Self-pay | Admitting: Family Medicine

## 2017-09-30 ENCOUNTER — Other Ambulatory Visit: Payer: Self-pay

## 2017-09-30 ENCOUNTER — Ambulatory Visit: Payer: Medicare Other | Admitting: Family Medicine

## 2017-09-30 VITALS — BP 126/78 | HR 85 | Temp 97.9°F | Resp 16 | Ht 63.78 in | Wt 137.0 lb

## 2017-09-30 DIAGNOSIS — R4189 Other symptoms and signs involving cognitive functions and awareness: Secondary | ICD-10-CM

## 2017-09-30 DIAGNOSIS — F419 Anxiety disorder, unspecified: Secondary | ICD-10-CM

## 2017-09-30 DIAGNOSIS — Z1211 Encounter for screening for malignant neoplasm of colon: Secondary | ICD-10-CM

## 2017-09-30 DIAGNOSIS — F329 Major depressive disorder, single episode, unspecified: Secondary | ICD-10-CM

## 2017-09-30 DIAGNOSIS — Z23 Encounter for immunization: Secondary | ICD-10-CM

## 2017-09-30 DIAGNOSIS — G4733 Obstructive sleep apnea (adult) (pediatric): Secondary | ICD-10-CM | POA: Diagnosis not present

## 2017-09-30 DIAGNOSIS — Z9989 Dependence on other enabling machines and devices: Secondary | ICD-10-CM | POA: Diagnosis not present

## 2017-09-30 DIAGNOSIS — E7800 Pure hypercholesterolemia, unspecified: Secondary | ICD-10-CM

## 2017-09-30 LAB — CBC WITH DIFFERENTIAL/PLATELET
Basophils Absolute: 0 10*3/uL (ref 0.0–0.2)
Basos: 1 %
EOS (ABSOLUTE): 0.1 10*3/uL (ref 0.0–0.4)
EOS: 2 %
HEMATOCRIT: 42.4 % (ref 34.0–46.6)
HEMOGLOBIN: 14 g/dL (ref 11.1–15.9)
IMMATURE GRANS (ABS): 0 10*3/uL (ref 0.0–0.1)
IMMATURE GRANULOCYTES: 0 %
Lymphocytes Absolute: 1 10*3/uL (ref 0.7–3.1)
Lymphs: 24 %
MCH: 30.4 pg (ref 26.6–33.0)
MCHC: 33 g/dL (ref 31.5–35.7)
MCV: 92 fL (ref 79–97)
MONOCYTES: 7 %
Monocytes Absolute: 0.3 10*3/uL (ref 0.1–0.9)
NEUTROS PCT: 66 %
Neutrophils Absolute: 2.7 10*3/uL (ref 1.4–7.0)
Platelets: 245 10*3/uL (ref 150–379)
RBC: 4.6 x10E6/uL (ref 3.77–5.28)
RDW: 13.1 % (ref 12.3–15.4)
WBC: 4.1 10*3/uL (ref 3.4–10.8)

## 2017-09-30 LAB — COMPREHENSIVE METABOLIC PANEL
ALBUMIN: 4.5 g/dL (ref 3.6–4.8)
ALT: 23 IU/L (ref 0–32)
AST: 21 IU/L (ref 0–40)
Albumin/Globulin Ratio: 2 (ref 1.2–2.2)
Alkaline Phosphatase: 57 IU/L (ref 39–117)
BUN / CREAT RATIO: 21 (ref 12–28)
BUN: 15 mg/dL (ref 8–27)
Bilirubin Total: 0.4 mg/dL (ref 0.0–1.2)
CALCIUM: 9.3 mg/dL (ref 8.7–10.3)
CO2: 25 mmol/L (ref 20–29)
CREATININE: 0.73 mg/dL (ref 0.57–1.00)
Chloride: 106 mmol/L (ref 96–106)
GFR calc Af Amer: 99 mL/min/{1.73_m2} (ref >59)
GFR, EST NON AFRICAN AMERICAN: 86 mL/min/{1.73_m2} (ref >59)
GLOBULIN, TOTAL: 2.3 g/dL (ref 1.5–4.5)
Glucose: 100 mg/dL — ABNORMAL HIGH (ref 65–99)
Potassium: 4 mmol/L (ref 3.5–5.2)
SODIUM: 144 mmol/L (ref 134–144)
TOTAL PROTEIN: 6.8 g/dL (ref 6.0–8.5)

## 2017-09-30 LAB — LIPID PANEL
CHOL/HDL RATIO: 2 ratio (ref 0.0–4.4)
Cholesterol, Total: 189 mg/dL (ref 100–199)
HDL: 96 mg/dL (ref >39)
LDL CALC: 71 mg/dL (ref 0–99)
TRIGLYCERIDES: 112 mg/dL (ref 0–149)
VLDL Cholesterol Cal: 22 mg/dL (ref 5–40)

## 2017-09-30 MED ORDER — ZOSTER VAC RECOMB ADJUVANTED 50 MCG/0.5ML IM SUSR: 0.5000 mL | Freq: Once | INTRAMUSCULAR | 1 refills | Status: AC

## 2017-09-30 MED ORDER — ESCITALOPRAM OXALATE 5 MG PO TABS: 5.0000 mg | ORAL_TABLET | Freq: Every day | ORAL | 1 refills | Status: DC

## 2017-09-30 MED ORDER — MEMANTINE HCL 5 MG PO TABS: 5.0000 mg | ORAL_TABLET | Freq: Every day | ORAL | 1 refills | Status: DC

## 2017-09-30 MED ORDER — VENLAFAXINE HCL ER 225 MG PO TB24: 1.0000 | ORAL_TABLET | Freq: Every day | ORAL | 1 refills | Status: DC

## 2017-09-30 MED ORDER — ATORVASTATIN CALCIUM 20 MG PO TABS: 20.0000 mg | ORAL_TABLET | Freq: Every day | ORAL | 1 refills | Status: DC

## 2017-09-30 NOTE — Progress Notes (Signed)
Subjective:    Patient ID: Olivia Marsh, female    DOB: 03/30/1951, 66 y.o.   MRN: [id number]  09/30/2017  Hyperlipidemia (3 month follow-up )    HPI This 66 y.o. female presents with aunt for evaluation of hypercholesterolemia, OSA on CPAP, cognitive impairment, anxiety and depression.   No changes made to management at last visit.  Aunt present at visit today. Patient forgot to fast today for visit.  No concerns.  Feels well.  Followed by neurology for dementia.  Not sure that Aricept is helping with worsening memory loss.  Stays very busy. Continues to keep granddaughter twice weekly.  Patient always remembers to pick up granddaughter from preschool.  Patient also remembers very well how to get to providers office today from [place].  Admits to being lonely at times due to living alone.  Remains very active with family.  Had a wonderful Thanksgiving season.  Recent follow-up with neurology in the past 6 months and good compliance with CPAP machine.   BP Readings from Last 3 Encounters:  09/30/17 126/78  06/23/17 132/72  03/10/17 140/80   Wt Readings from Last 3 Encounters:  09/30/17 137 lb (62.1 kg)  06/23/17 137 lb (62.1 kg)  03/10/17 136 lb 3.2 oz (61.8 kg)   Immunization History  Administered Date(s) Administered  . Influenza,inj,Quad PF,6+ Mos 09/18/2014, 09/17/2015, 10/21/2016, 09/30/2017  . Pneumococcal Conjugate-13 05/14/2016  . Pneumococcal Polysaccharide-23 06/23/2017  . Tdap 03/15/2014  . Zoster 11/04/2015    Review of Systems  Constitutional: Negative for chills, diaphoresis, fatigue and fever.  Eyes: Negative for visual disturbance.  Respiratory: Negative for cough and shortness of breath.   Cardiovascular: Negative for chest pain, palpitations and leg swelling.  Gastrointestinal: Negative for abdominal pain, constipation, diarrhea, nausea and vomiting.  Endocrine: Negative for cold intolerance, heat intolerance, polydipsia, polyphagia and polyuria.    Neurological: Negative for dizziness, tremors, seizures, syncope, facial asymmetry, speech difficulty, weakness, light-headedness, numbness and headaches.  Psychiatric/Behavioral: Positive for decreased concentration. Negative for dysphoric mood, self-injury, sleep disturbance and suicidal ideas. The patient is not nervous/anxious.     Past Medical History:  Diagnosis Date  . Anemia   . Anxiety   . GAD (generalized anxiety disorder)   . GERD (gastroesophageal reflux disease)   . Heart murmur    history of  . Hyperlipidemia   . Hypertension   . Leukopenia   . MDD (major depressive disorder)   . Mild depression ([place])   . OCD (obsessive compulsive disorder)   . OSA on CPAP    Past Surgical History:  Procedure Laterality Date  . BREAST MASS EXCISION Right   . BREAST SURGERY    . COLONOSCOPY     [place].   No Known Allergies Current Outpatient Medications on File Prior to Visit  Medication Sig Dispense Refill  . Ascorbic Acid (VITAMIN C PO) Take by mouth daily. Reported on 05/14/2016    . aspirin 81 MG chewable tablet Chew by mouth daily.    . Cholecalciferol (VITAMIN D3) 1000 UNITS CAPS Take by mouth daily. Reported on 05/14/2016    . donepezil (ARICEPT) 10 MG tablet TAKE 1/2 TABLET AT BEDTIME FOR 30 DAYS THEN INCREASE TO 1 TABLET AT BEDTIME  6  . Multiple Vitamins-Minerals (CENTRUM SILVER ULTRA WOMENS) TABS Take by mouth daily.    . Phosphatidylserine-DHA-EPA (VAYACOG PO) Take by mouth.     No current facility-administered medications on file prior to visit.    Social History   Socioeconomic History  .  Marital status: Divorced    Spouse name: Not on file  . Number of children: 2  . Years of education: Not on file  . Highest education level: Not on file  Social Needs  . Financial resource strain: Not on file  . Food insecurity - worry: Not on file  . Food insecurity - inability: Not on file  . Transportation needs - medical: Not on file  . Transportation needs -  non-medical: Not on file  Occupational History  . Occupation: unemployed  Tobacco Use  . Smoking status: Never Smoker  . Smokeless tobacco: Never Used  Substance and Sexual Activity  . Alcohol use: Yes    Comment: occasional  . Drug use: No  . Sexual activity: Not Currently    Birth control/protection: Post-menopausal  Other Topics Concern  . Not on file  Social History Narrative   Martial status: married x 20 years; second marriage.  Does not live with husband.  Hus[place] lives at the beach.   Mother in [place]; really likes church in [place].      Children: 2 children (33yo son, 23 daughter); 1 granddaughter (23 yo) in [place].        Lives: alone in [place]; husband lives at [place].       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      Tobacco: never      Alcohol:  None       Exercise: no formal exercise; stays busy all day.      Seatbelt: 100%; no texting      Guns: loaded and unsecured.        Advanced Directives: HCPOA: Logan Sharp/son.  FULL CODE but no prolonged measures.     Family History  Problem Relation Age of Onset  . Hypertension Mother   . Lupus Mother   . Dementia Mother   . Heart disease Mother 58       aortic valve replacement  . Hypertension Father   . Heart disease Father 45       CABG/CAD; smoker and drinker  . Alcohol abuse Father   . Dementia Father   . Cancer Father        throat cancer; tobacco abuse.  . Arthritis Sister        Rheumatoid Arthritis       Objective:    BP 126/78   Pulse 85   Temp 97.9 F (36.6 C) (Oral)   Resp 16   Ht 5' 3.78" (1.62 m)   Wt 137 lb (62.1 kg)   SpO2 97%   BMI 23.68 kg/m  Physical Exam  Constitutional: She is oriented to person, place, and time. She appears well-developed and well-nourished. No distress.  HENT:  Head: Normocephalic and atraumatic.  Right Ear: External ear normal.  Left Ear: External ear normal.  Nose: Nose normal.  Mouth/Throat:  Oropharynx is clear and moist.  Eyes: Conjunctivae and EOM are normal. Pupils are equal, round, and reactive to light.  Neck: Normal range of motion. Neck supple. Carotid bruit is not present. No thyromegaly present.  Cardiovascular: Normal rate, regular rhythm, normal heart sounds and intact distal pulses. Exam reveals no gallop and no friction rub.  No murmur heard. Pulmonary/Chest: Effort normal and breath sounds normal. She has no wheezes. She has no rales.  Abdominal: Soft. Bowel sounds are normal. She exhibits no distension and no mass. There is no tenderness. There is no rebound and no guarding.  Lymphadenopathy:  She has no cervical adenopathy.  Neurological: She is alert and oriented to person, place, and time. No cranial nerve deficit.  Skin: Skin is warm and dry. No rash noted. She is not diaphoretic. No erythema. No pallor.  Psychiatric: She has a normal mood and affect. Her behavior is normal.   No results found. Depression screen [facility] 2/9 09/30/2017 06/23/2017 03/10/2017 12/03/2016 10/21/2016  Decreased Interest 0 0 0 0 0  Down, Depressed, Hopeless 0 0 0 0 0  PHQ - 2 Score 0 0 0 0 0   Fall Risk  09/30/2017 06/23/2017 03/10/2017 10/21/2016 05/14/2016  Falls in the past year? No No No No No   MMSE - Mini Mental State Exam 09/30/2017 03/10/2017 12/03/2016 10/21/2016  Orientation to time 5 5 4 5   Orientation to Place 5 5 5 5   Registration 3 3 3 3   Attention/ Calculation 3 5 5 5   Recall 1 3 3 3   Language- name 2 objects 2 2 2 2   Language- repeat 1 1 1 1   Language- follow 3 step command 2 3 3 3   Language- read & follow direction 1 1 1 1   Write a sentence 1 1 1 1   Copy design 1 1 1 1   Total score 25 30 29 30          Assessment & Plan:   1. Pure hypercholesterolemia   2. Cognitive impairment   3. Anxiety and depression   4. OSA on CPAP   5. Need for prophylactic vaccination and inoculation against influenza   6. Colon cancer screening     Well-controlled  hypercholesterolemia.  Obtain labs for chronic disease management.  Continue current medication.  Progressive dementia.  Family requesting additional medication.  Agreeable to starting Namenda 5 mg twice daily.  Continue Aricept 10 mg daily.  Will be followed up with neurology in the upcoming 3 months.  Anxiety and depression appear well controlled on current regimen of Lexapro and Effexor.  No changes be made to regimen today.  Patient does not recall last colonoscopy.  In medical record, Dr. Oletta Lamas completed colonoscopy.  I will obtain a release of information and his colonoscopy report.  There is no evidence of a colonoscopy by Dr. Vira Agar in epic.  Patient does feel that she has had a colonoscopy with Dr. Vira Agar.  Will request records from [place] as well.  Reported good compliance with apnea CPAP machine.  Orders Placed This Encounter  Procedures  . Flu Vaccine QUAD 36+ mos IM  . CBC with Differential/Platelet  . Comprehensive metabolic panel    Order Specific Question:   Has the patient fasted?    Answer:   No  . Lipid panel    Order Specific Question:   Has the patient fasted?    Answer:   No  . Ambulatory referral to Gastroenterology    Referral Priority:   Routine    Referral Type:   Consultation    Referral Reason:   Specialty Services Required    Number of Visits Requested:   1  . Care order/instruction:    Scheduling Instructions:     Perform mini mental status exam   Meds ordered this encounter  Medications  . Zoster Vaccine Adjuvanted [facility]) injection    Sig: Inject 0.5 mLs into the muscle once for 1 dose.    Dispense:  0.5 mL    Refill:  1  . atorvastatin (LIPITOR) 20 MG tablet    Sig: Take 1 tablet (20 mg total)  by mouth daily at 6 PM.    Dispense:  90 tablet    Refill:  1  . escitalopram (LEXAPRO) 5 MG tablet    Sig: Take 1 tablet (5 mg total) by mouth daily.    Dispense:  90 tablet    Refill:  1  . Venlafaxine HCl 225 MG TB24    Sig: Take 1  tablet (225 mg total) by mouth daily.    Dispense:  90 tablet    Refill:  1  . memantine (NAMENDA) 5 MG tablet    Sig: Take 1 tablet (5 mg total) by mouth daily.    Dispense:  90 tablet    Refill:  1    Return in about 4 months (around 01/28/2018) for follow-up chronic medical conditions.   Kristi Elayne Guerin, M.D. Primary Care at [facility] previously [facility] [place] [address] [place], [place]  [address] [phone] phone [phone] fax

## 2017-09-30 NOTE — Patient Instructions (Signed)
     IF you received an x-ray today, you will receive an invoice from [facility]. Please contact [facility] at [phone] with questions or concerns regarding your invoice.   IF you received labwork today, you will receive an invoice from [facility]. Please contact [facility] at [phone] with questions or concerns regarding your invoice.   Our billing staff will not be able to assist you with questions regarding bills from these companies.  You will be contacted with the lab results as soon as they are available. The fastest way to get your results is to activate your My Chart account. Instructions are located on the last page of this paperwork. If you have not heard from us regarding the results in 2 weeks, please contact this office.     

## 2017-12-10 ENCOUNTER — Encounter: Payer: Self-pay | Admitting: Family Medicine

## 2018-02-02 NOTE — Progress Notes (Signed)
Subjective:    Patient ID: Olivia Marsh, female    DOB: 1951-07-24, 67 y.o.   MRN: [id number]  02/03/2018  Follow-up (follow up for Pure Hypercholesteremia)    HPI This 67 y.o. female presents with adult son for four month follow-up of hypercholesterolemia and depression/anxiety, dementia/cognitive impairment, OSA.  Management changes made at last visit include the following: Well-controlled hypercholesterolemia.  Obtain labs for chronic disease management.  Continue current medication. Progressive dementia.  Family requesting additional medication.  Agreeable to starting Namenda 5 mg twice daily.  Continue Aricept 10 mg daily.  Will be followed up with neurology in the upcoming 3 months. Anxiety and depression appear well controlled on current regimen of Lexapro and Effexor.  No changes be made to regimen today. Patient does not recall last colonoscopy.  In medical record, Dr. Oletta Lamas completed colonoscopy.  I will obtain a release of information and his colonoscopy report.  There is no evidence of a colonoscopy by Dr. Vira Agar in epic.  Patient does feel that she has had a colonoscopy with Dr. Vira Agar.  Will request records from [place] as well.  [phone] Drenda Freeze  Notes from recent neurology consultation include the following: 1. Obstructive sleep apnea - Not using CPAP consistently - Patient is not using PAP therapy, therefore she is not benefiting from the PAP device and download was reviewed.  - When she use it, she did benefit from it, she only used it 4/30 days (13% compliance)  - AHI very well controlled at 0.5.  - 95th percentile leak borderline high at 22. 5 L/min. Recommended for patient to use consistently and to be careful to keep mask on properly to avoid leaking of air and consider using chinstrap for reducing leak. 2. Cognitive impairment - stable - Continue donepezil 10mg  nightly.  - Continue Namenda 5 mg two times a day  3. History of depression and  anxiety - Stable on Effexor Return in about 6 months (around 06/13/2018) for memory loss. or earlier if needed. This patient was discussed with Dr. Jennings Books who agree with the assessment and plan. I personally performed the service, non-incident to. (WP) Rufina Falco, DNP, FNP-BC Board Certified Nurse Practitioner [facility]   UPDATES: Son present; gradual cognitive decline over the past year. Never forgets to pick up granddaughter on Mondays. Bible study on Wednesday mornings; never forgets to attend. Forgets to get to dentist. Repetitive stories to family members and friends.   Second guesses self now; usually knows the answer to her own questions yet very insecure now.   No burning of food. Walking twice daily; walks within [employer]. Son questions diagnosis.   Does not feel depressed or anxious.    BP Readings from Last 3 Encounters:  02/03/18 132/84  09/30/17 126/78  06/23/17 132/72   Wt Readings from Last 3 Encounters:  02/03/18 132 lb 3.2 oz (60 kg)  09/30/17 137 lb (62.1 kg)  06/23/17 137 lb (62.1 kg)   Immunization History  Administered Date(s) Administered  . Influenza,inj,Quad PF,6+ Mos 09/18/2014, 09/17/2015, 10/21/2016, 09/30/2017  . Pneumococcal Conjugate-13 05/14/2016  . Pneumococcal Polysaccharide-23 06/23/2017  . Tdap 03/15/2014  . Zoster 11/04/2015    Review of Systems  Constitutional: Negative for chills, diaphoresis, fatigue and fever.  Eyes: Negative for visual disturbance.  Respiratory: Negative for cough and shortness of breath.   Cardiovascular: Negative for chest pain, palpitations and leg swelling.  Gastrointestinal: Negative for abdominal pain, constipation, diarrhea, nausea and  vomiting.  Endocrine: Negative for cold intolerance, heat intolerance, polydipsia, polyphagia and polyuria.  Neurological: Negative for dizziness, tremors, seizures, syncope, facial asymmetry, speech  difficulty, weakness, light-headedness, numbness and headaches.  Psychiatric/Behavioral: Positive for decreased concentration. Negative for dysphoric mood, hallucinations, self-injury, sleep disturbance and suicidal ideas. The patient is not nervous/anxious and is not hyperactive.     Past Medical History:  Diagnosis Date  . Anemia   . Anxiety   . GAD (generalized anxiety disorder)   . GERD (gastroesophageal reflux disease)   . Heart murmur    history of  . Hyperlipidemia   . Hypertension   . Leukopenia   . MDD (major depressive disorder)   . Mild depression ([place])   . OCD (obsessive compulsive disorder)   . OSA on CPAP    Past Surgical History:  Procedure Laterality Date  . BREAST MASS EXCISION Right   . BREAST SURGERY    . COLONOSCOPY     [place].   No Known Allergies Current Outpatient Medications on File Prior to Visit  Medication Sig Dispense Refill  . Ascorbic Acid (VITAMIN C PO) Take by mouth daily. Reported on 05/14/2016    . aspirin 81 MG chewable tablet Chew by mouth daily.    Marland Kitchen atorvastatin (LIPITOR) 20 MG tablet Take 1 tablet (20 mg total) by mouth daily at 6 PM. 90 tablet 1  . Cholecalciferol (VITAMIN D3) 1000 UNITS CAPS Take by mouth daily. Reported on 05/14/2016    . donepezil (ARICEPT) 10 MG tablet TAKE 1/2 TABLET AT BEDTIME FOR 30 DAYS THEN INCREASE TO 1 TABLET AT BEDTIME  6  . escitalopram (LEXAPRO) 5 MG tablet Take 1 tablet (5 mg total) by mouth daily. 90 tablet 1  . Multiple Vitamins-Minerals (CENTRUM SILVER ULTRA WOMENS) TABS Take by mouth daily.    . Phosphatidylserine-DHA-EPA (VAYACOG PO) Take by mouth.    . Venlafaxine HCl 225 MG TB24 Take 1 tablet (225 mg total) by mouth daily. 90 tablet 1  . memantine (NAMENDA) 5 MG tablet Take 1 tablet (5 mg total) by mouth daily. (Patient not taking: Reported on 02/03/2018) 90 tablet 1   No current facility-administered medications on file prior to visit.    Social History   Socioeconomic History  . Marital status:  Divorced    Spouse name: Not on file  . Number of children: 2  . Years of education: Not on file  . Highest education level: Not on file  Occupational History  . Occupation: unemployed  Social Needs  . Financial resource strain: Not on file  . Food insecurity:    Worry: Not on file    Inability: Not on file  . Transportation needs:    Medical: Not on file    Non-medical: Not on file  Tobacco Use  . Smoking status: Never Smoker  . Smokeless tobacco: Never Used  Substance and Sexual Activity  . Alcohol use: Yes    Comment: occasional  . Drug use: No  . Sexual activity: Not Currently    Birth control/protection: Post-menopausal  Lifestyle  . Physical activity:    Days per week: Not on file    Minutes per session: Not on file  . Stress: Not on file  Relationships  . Social connections:    Talks on phone: Not on file    Gets together: Not on file    Attends religious service: Not on file    Active member of club or organization: Not on file    Attends meetings  of clubs or organizations: Not on file    Relationship status: Not on file  . Intimate partner violence:    Fear of current or ex partner: Not on file    Emotionally abused: Not on file    Physically abused: Not on file    Forced sexual activity: Not on file  Other Topics Concern  . Not on file  Social History Narrative   Martial status: married x 20 years; second marriage.  Does not live with husband.  H[place] the beach.   Mother in [place]; really likes church in [place].      Children: 2 children (32yo son, 27 daughter); 1 granddaughter (68 yo) in [place].        Lives: alone in [place]; husband lives at [place].       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      Tobacco: never      Alcohol:  None       Exercise: no formal exercise; stays busy all day.      Seatbelt: 100%; no texting      Guns: loaded and unsecured.        Advanced Directives: HCPOA:  Logan Sharp/son.  FULL CODE but no prolonged measures.     Family History  Problem Relation Age of Onset  . Hypertension Mother   . Lupus Mother   . Dementia Mother   . Heart disease Mother 20       aortic valve replacement  . Hypertension Father   . Heart disease Father 68       CABG/CAD; smoker and drinker  . Alcohol abuse Father   . Dementia Father   . Cancer Father        throat cancer; tobacco abuse.  . Arthritis Sister        Rheumatoid Arthritis       Objective:    BP 132/84 (BP Location: Left Arm, Patient Position: Sitting, Cuff Size: Normal)   Pulse 80   Temp 98.5 F (36.9 C) (Oral)   Ht 5' 4.4" (1.636 m)   Wt 132 lb 3.2 oz (60 kg)   SpO2 98%   BMI 22.41 kg/m  Physical Exam  Constitutional: She is oriented to person, place, and time. She appears well-developed and well-nourished. No distress.  HENT:  Head: Normocephalic and atraumatic.  Right Ear: External ear normal.  Left Ear: External ear normal.  Nose: Nose normal.  Mouth/Throat: Oropharynx is clear and moist.  Eyes: Pupils are equal, round, and reactive to light. Conjunctivae and EOM are normal.  Neck: Normal range of motion. Neck supple. Carotid bruit is not present. No thyromegaly present.  Cardiovascular: Normal rate, regular rhythm, normal heart sounds and intact distal pulses. Exam reveals no gallop and no friction rub.  No murmur heard. Pulmonary/Chest: Effort normal and breath sounds normal. She has no wheezes. She has no rales.  Abdominal: Soft. Bowel sounds are normal. She exhibits no distension and no mass. There is no tenderness. There is no rebound and no guarding.  Lymphadenopathy:    She has no cervical adenopathy.  Neurological: She is alert and oriented to person, place, and time. No cranial nerve deficit. She exhibits normal muscle tone. Coordination normal.  Skin: Skin is warm and dry. No rash noted. She is not diaphoretic. No erythema. No pallor.  Psychiatric: She has a normal mood  and affect. Her behavior is normal. Judgment and thought content normal.   No results  found. Depression screen [facility] 2/9 02/03/2018 09/30/2017 06/23/2017 03/10/2017 12/03/2016  Decreased Interest 0 0 0 0 0  Down, Depressed, Hopeless 0 0 0 0 0  PHQ - 2 Score 0 0 0 0 0   Fall Risk  02/03/2018 09/30/2017 06/23/2017 03/10/2017 10/21/2016  Falls in the past year? No No No No No   MMSE - Mini Mental State Exam 09/30/2017 03/10/2017 12/03/2016 10/21/2016  Orientation to time 5 5 4 5   Orientation to Place 5 5 5 5   Registration 3 3 3 3   Attention/ Calculation 3 5 5 5   Recall 1 3 3 3   Language- name 2 objects 2 2 2 2   Language- repeat 1 1 1 1   Language- follow 3 step command 2 3 3 3   Language- read & follow direction 1 1 1 1   Write a sentence 1 1 1 1   Copy design 1 1 1 1   Total score 25 30 29 30          Assessment & Plan:   1. Memory loss   2. OSA on CPAP   3. Anxiety and depression   4. Pure hypercholesterolemia    Memory loss: Worsening per son.  Questions diagnosis of dementia.  Desires second opinion and agreeable.  Refer for neuropsychiatric evaluation to evaluate for other contributing causes to memory loss such as attention deficit or depression anxiety.  Continue Namenda and Aricept at this time.  Continue antianxiety and antidepressant medications at this time.  Has undergone psychiatric evaluation in the past 2 years to evaluate depression and anxiety further.  Anxiety and depression medications adjusted yet no follow-up with psychiatry at that time.  Will repeat labs today including vitamin D, vitamin B12, thyroid labs.  Obstructive sleep apnea: With noncompliance with CPAP therapy.  Neurology feels that untreated obstructive sleep apnea likely contributing to memory loss.  Highly encourage nightly compliance with CPAP therapy.  Hypercholesterolemia: Controlled.  Obtain labs for chronic disease management.  No change to therapy at this time.   Orders Placed This Encounter  Procedures  .  Comprehensive metabolic panel    Order Specific Question:   Has the patient fasted?    Answer:   No  . Lipid panel    Order Specific Question:   Has the patient fasted?    Answer:   No  . VITAMIN D 25 Hydroxy (Vit-D Deficiency, Fractures)  . Vitamin B12  . T4, free  . TSH  . Ambulatory referral to Neuropsychology    Referral Priority:   Routine    Referral Type:   Psychiatric    Referral Reason:   Specialty Services Required    Requested Specialty:   Psychology    Number of Visits Requested:   1  . Ambulatory referral to Neurology    Referral Priority:   Routine    Referral Type:   Consultation    Referral Reason:   Specialty Services Required    Requested Specialty:   Neurology    Number of Visits Requested:   1   No orders of the defined types were placed in this encounter.   Return in about 4 months (around 06/05/2018).   Kevina Piloto Elayne Guerin, M.D. Primary Care at [facility] previously [facility] [place] [address] [place], [place]  [address] [phone] phone [phone] fax

## 2018-02-03 ENCOUNTER — Other Ambulatory Visit: Payer: Self-pay

## 2018-02-03 ENCOUNTER — Encounter: Payer: Self-pay | Admitting: Family Medicine

## 2018-02-03 ENCOUNTER — Ambulatory Visit: Payer: Medicare Other | Admitting: Family Medicine

## 2018-02-03 VITALS — BP 132/84 | HR 80 | Temp 98.5°F | Ht 64.4 in | Wt 132.2 lb

## 2018-02-03 DIAGNOSIS — Z9989 Dependence on other enabling machines and devices: Secondary | ICD-10-CM

## 2018-02-03 DIAGNOSIS — F419 Anxiety disorder, unspecified: Secondary | ICD-10-CM | POA: Diagnosis not present

## 2018-02-03 DIAGNOSIS — F329 Major depressive disorder, single episode, unspecified: Secondary | ICD-10-CM

## 2018-02-03 DIAGNOSIS — R413 Other amnesia: Secondary | ICD-10-CM | POA: Diagnosis not present

## 2018-02-03 DIAGNOSIS — E7800 Pure hypercholesterolemia, unspecified: Secondary | ICD-10-CM | POA: Diagnosis not present

## 2018-02-03 DIAGNOSIS — G4733 Obstructive sleep apnea (adult) (pediatric): Secondary | ICD-10-CM

## 2018-02-03 NOTE — Patient Instructions (Signed)
     IF you received an x-ray today, you will receive an invoice from [facility]. Please contact [facility] at [phone] with questions or concerns regarding your invoice.   IF you received labwork today, you will receive an invoice from [facility]. Please contact [facility] at [phone] with questions or concerns regarding your invoice.   Our billing staff will not be able to assist you with questions regarding bills from these companies.  You will be contacted with the lab results as soon as they are available. The fastest way to get your results is to activate your My Chart account. Instructions are located on the last page of this paperwork. If you have not heard from us regarding the results in 2 weeks, please contact this office.     

## 2018-02-04 LAB — COMPREHENSIVE METABOLIC PANEL
ALT: 16 IU/L (ref 0–32)
AST: 20 IU/L (ref 0–40)
Albumin/Globulin Ratio: 1.6 (ref 1.2–2.2)
Albumin: 4.4 g/dL (ref 3.6–4.8)
Alkaline Phosphatase: 60 IU/L (ref 39–117)
BUN/Creatinine Ratio: 29 — ABNORMAL HIGH (ref 12–28)
BUN: 18 mg/dL (ref 8–27)
Bilirubin Total: 0.6 mg/dL (ref 0.0–1.2)
CALCIUM: 9.4 mg/dL (ref 8.7–10.3)
CHLORIDE: 105 mmol/L (ref 96–106)
CO2: 23 mmol/L (ref 20–29)
Creatinine, Ser: 0.63 mg/dL (ref 0.57–1.00)
GFR calc Af Amer: 108 mL/min/{1.73_m2} (ref >59)
GFR, EST NON AFRICAN AMERICAN: 94 mL/min/{1.73_m2} (ref >59)
GLUCOSE: 97 mg/dL (ref 65–99)
Globulin, Total: 2.7 g/dL (ref 1.5–4.5)
Potassium: 3.8 mmol/L (ref 3.5–5.2)
Sodium: 146 mmol/L — ABNORMAL HIGH (ref 134–144)
TOTAL PROTEIN: 7.1 g/dL (ref 6.0–8.5)

## 2018-02-04 LAB — TSH: TSH: 1.39 u[IU]/mL (ref 0.450–4.500)

## 2018-02-04 LAB — T4, FREE: FREE T4: 1.31 ng/dL (ref 0.82–1.77)

## 2018-02-04 LAB — LIPID PANEL
CHOL/HDL RATIO: 2.8 ratio (ref 0.0–4.4)
Cholesterol, Total: 243 mg/dL — ABNORMAL HIGH (ref 100–199)
HDL: 88 mg/dL (ref >39)
LDL Calculated: 137 mg/dL — ABNORMAL HIGH (ref 0–99)
TRIGLYCERIDES: 88 mg/dL (ref 0–149)
VLDL CHOLESTEROL CAL: 18 mg/dL (ref 5–40)

## 2018-02-04 LAB — VITAMIN D 25 HYDROXY (VIT D DEFICIENCY, FRACTURES): Vit D, 25-Hydroxy: 29.4 ng/mL — ABNORMAL LOW (ref 30.0–100.0)

## 2018-02-04 LAB — VITAMIN B12: Vitamin B-12: 468 pg/mL (ref 232–1245)

## 2018-02-19 ENCOUNTER — Telehealth: Payer: Self-pay | Admitting: Family Medicine

## 2018-02-19 NOTE — Telephone Encounter (Signed)
Please see below.

## 2018-02-19 NOTE — Telephone Encounter (Unsigned)
Copied from [place] [id number]. Topic: Referral - Request >> Feb 19, 2018 11:56 AM Hewitt Shorts wrote: Pt son is calling to say that the neuropshycology referral needs to be sent to [facility]  Best number for the son is [phone] [facility]

## 2018-03-17 ENCOUNTER — Encounter: Payer: Self-pay | Admitting: Family Medicine

## 2018-03-23 NOTE — Telephone Encounter (Signed)
Referrals, Please check status of neurology referral at [facility].  Patient has seen Dr. Manuella Ghazi at [facility] in [place] which is affiliated with [facility]. I am wanting her to see a neurologist at [facility] in [place].  What is the status of this referral?  Also, I had requested a referral to neuropsych at [facility]. Pt advised me that she was scheduled in [facility].  Can we switch to [facility]?  Please call son, Drenda Freeze with update.

## 2018-03-30 ENCOUNTER — Encounter: Payer: Self-pay | Admitting: Family Medicine

## 2018-03-30 NOTE — Telephone Encounter (Signed)
[place] or Warden/ranger, please scan/email this letter to patient's daughter at [email or web address].

## 2018-06-07 ENCOUNTER — Ambulatory Visit: Payer: Medicare Other | Admitting: Family Medicine

## 2018-08-17 ENCOUNTER — Other Ambulatory Visit: Payer: Self-pay | Admitting: Family Medicine

## 2018-08-17 DIAGNOSIS — Z1231 Encounter for screening mammogram for malignant neoplasm of breast: Secondary | ICD-10-CM

## 2018-08-25 ENCOUNTER — Other Ambulatory Visit: Payer: Self-pay | Admitting: Family Medicine

## 2018-08-25 DIAGNOSIS — E2839 Other primary ovarian failure: Secondary | ICD-10-CM

## 2018-12-22 ENCOUNTER — Ambulatory Visit
Admission: RE | Admit: 2018-12-22 | Discharge: 2018-12-22 | Disposition: A | Discharge status: Home / Self Care | Payer: Medicare Other | Source: Ambulatory Visit | Attending: Family Medicine | Admitting: Family Medicine

## 2018-12-22 DIAGNOSIS — E2839 Other primary ovarian failure: Secondary | ICD-10-CM | POA: Diagnosis not present

## 2018-12-22 DIAGNOSIS — Z1231 Encounter for screening mammogram for malignant neoplasm of breast: Secondary | ICD-10-CM | POA: Insufficient documentation

## 2019-04-05 IMAGING — MG DIGITAL SCREENING BILATERAL MAMMOGRAM WITH TOMO AND CAD
6 of 10 series · 6 of 30 positions shown · non-contrast
Comparison: Previous exam(s).

CLINICAL DATA: Screening.

EXAM:
DIGITAL SCREENING BILATERAL MAMMOGRAM WITH TOMO AND CAD

[R MLO synth-2D]
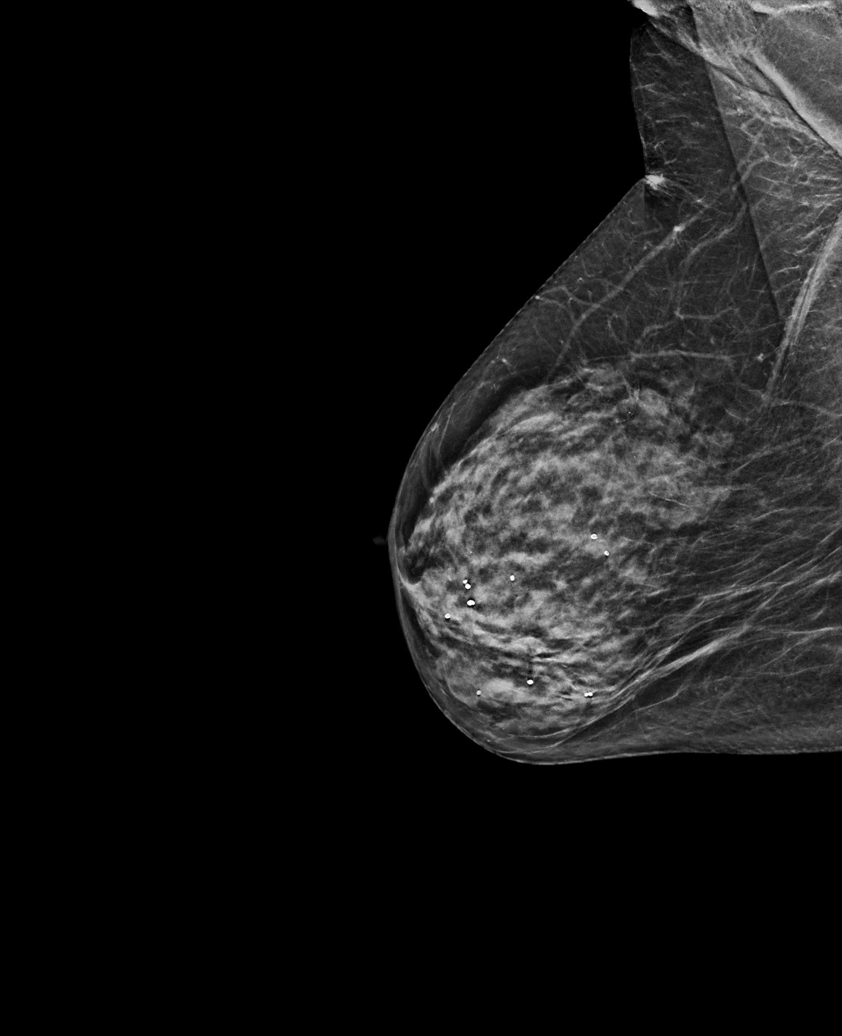

[L MLO synth-2D]
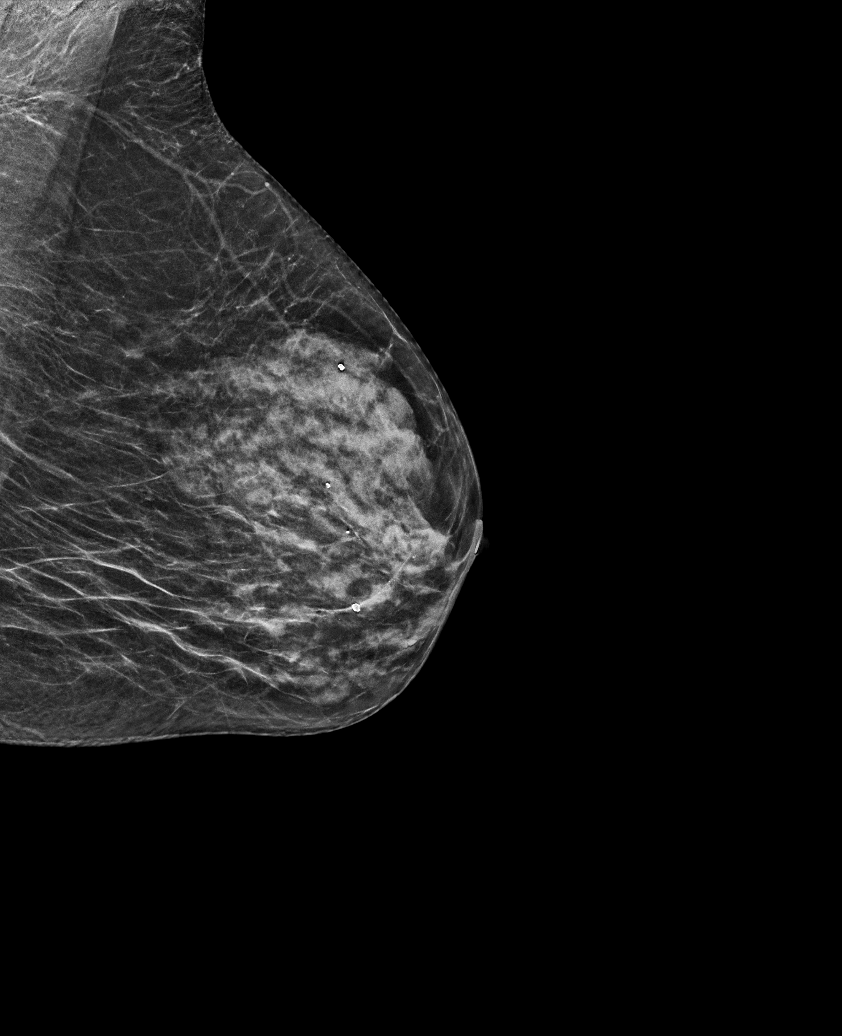

[L CC synth-2D]
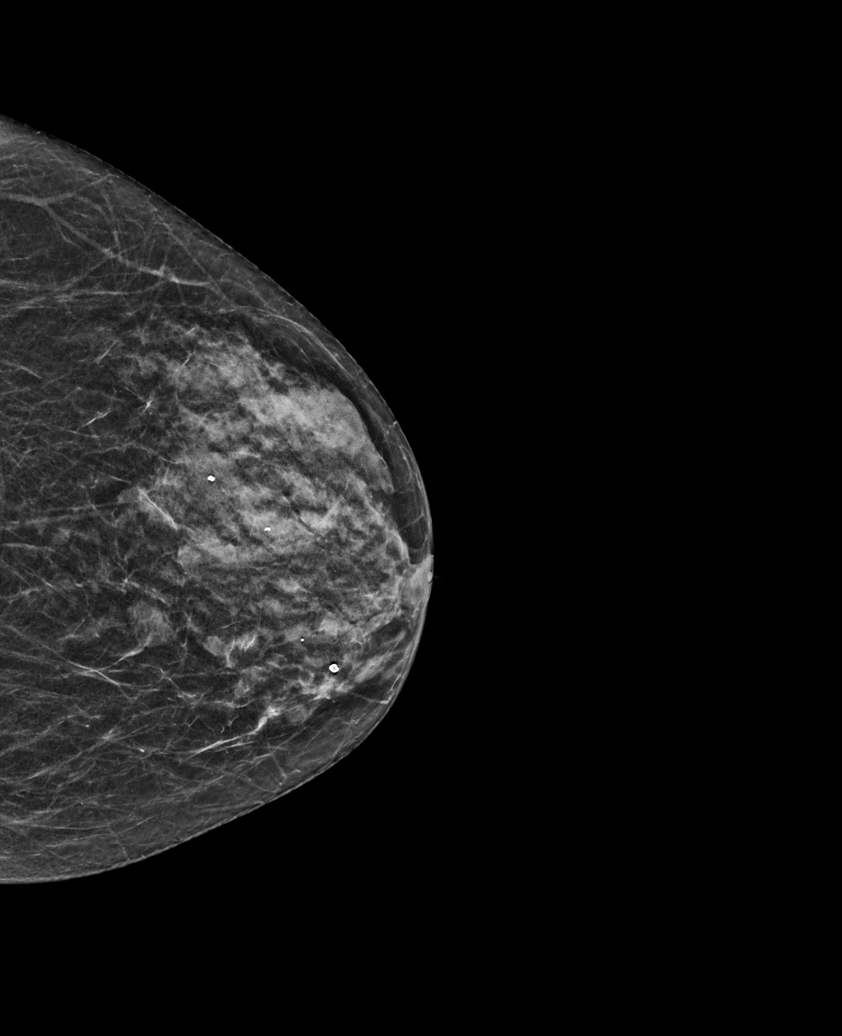

[R CC synth-2D]
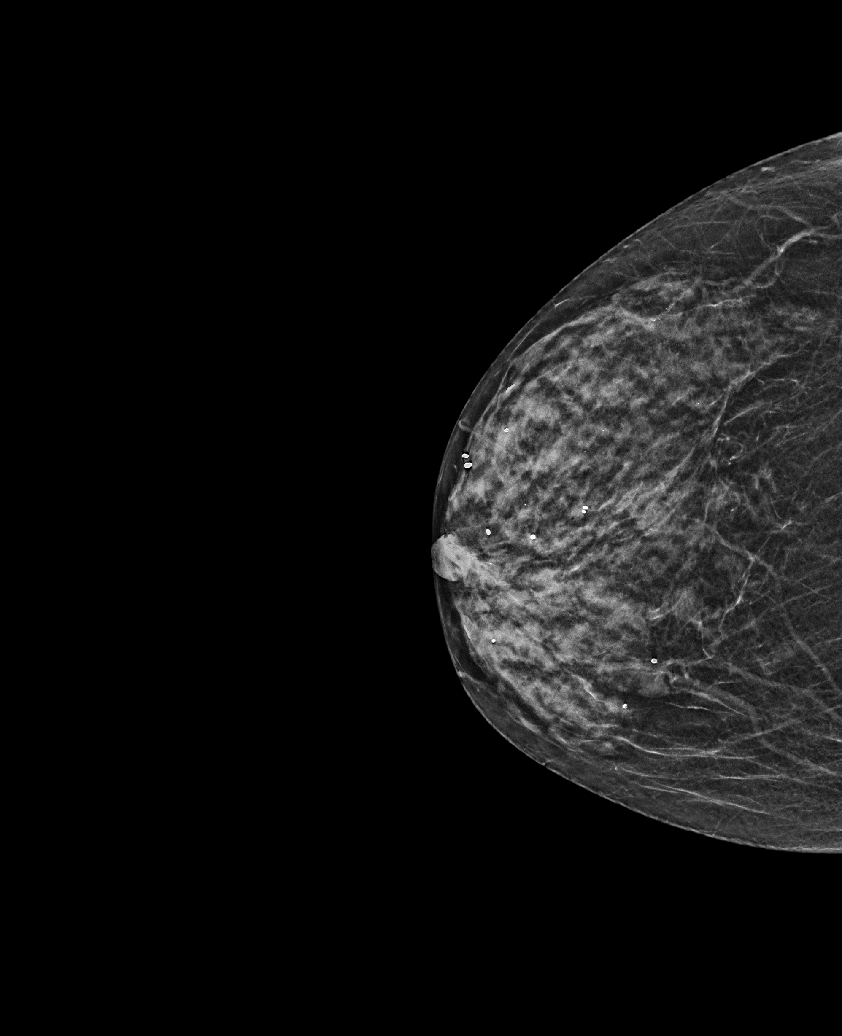

[R AT synth-2D]
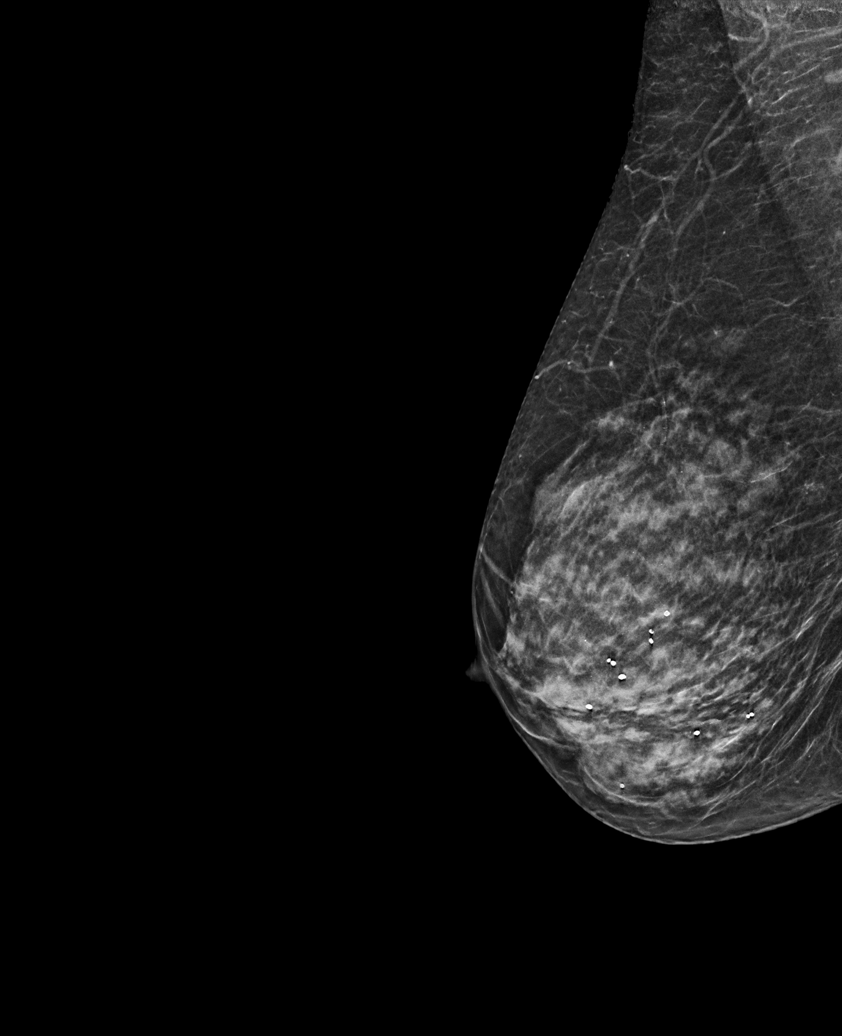

[L CC tomo · tomo slice 23/45.0]
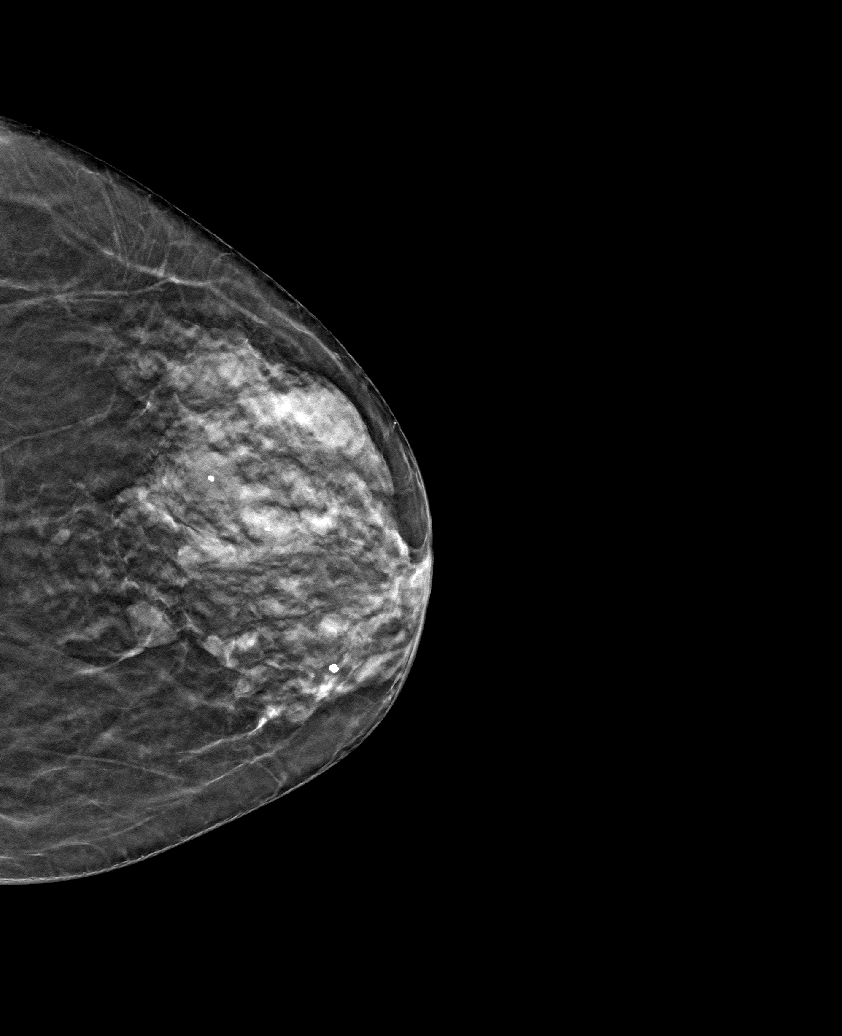

[6 of 30 positions shown; findings below may reference images not displayed]

ACR Breast Density Category c: The breast tissue is heterogeneously
dense, which may obscure small masses.
FINDINGS: There are no findings suspicious for malignancy. Images were
processed with CAD.
IMPRESSION: No mammographic evidence of malignancy. A result letter of this
screening mammogram will be mailed directly to the patient.

RECOMMENDATION:
Screening mammogram in one year. (Code:[id number])

BI-RADS CATEGORY  1: Negative.

## 2019-08-01 ENCOUNTER — Encounter: Payer: Self-pay | Admitting: Emergency Medicine

## 2019-08-01 ENCOUNTER — Emergency Department
Admission: EM | Admit: 2019-08-01 | Discharge: 2019-08-01 | Disposition: A | Discharge status: Home / Self Care | Payer: Medicare Other | Attending: Emergency Medicine | Admitting: Emergency Medicine

## 2019-08-01 ENCOUNTER — Other Ambulatory Visit: Payer: Self-pay

## 2019-08-01 ENCOUNTER — Emergency Department: Payer: Medicare Other

## 2019-08-01 DIAGNOSIS — Y92009 Unspecified place in unspecified non-institutional (private) residence as the place of occurrence of the external cause: Secondary | ICD-10-CM | POA: Diagnosis not present

## 2019-08-01 DIAGNOSIS — I10 Essential (primary) hypertension: Secondary | ICD-10-CM | POA: Diagnosis not present

## 2019-08-01 DIAGNOSIS — W0110XA Fall on same level from slipping, tripping and stumbling with subsequent striking against unspecified object, initial encounter: Secondary | ICD-10-CM | POA: Insufficient documentation

## 2019-08-01 DIAGNOSIS — Y9301 Activity, walking, marching and hiking: Secondary | ICD-10-CM | POA: Insufficient documentation

## 2019-08-01 DIAGNOSIS — W19XXXA Unspecified fall, initial encounter: Secondary | ICD-10-CM

## 2019-08-01 DIAGNOSIS — Z79899 Other long term (current) drug therapy: Secondary | ICD-10-CM | POA: Diagnosis not present

## 2019-08-01 DIAGNOSIS — S01511A Laceration without foreign body of lip, initial encounter: Secondary | ICD-10-CM | POA: Diagnosis not present

## 2019-08-01 DIAGNOSIS — Z23 Encounter for immunization: Secondary | ICD-10-CM | POA: Insufficient documentation

## 2019-08-01 DIAGNOSIS — S0993XA Unspecified injury of face, initial encounter: Secondary | ICD-10-CM | POA: Diagnosis not present

## 2019-08-01 DIAGNOSIS — Y999 Unspecified external cause status: Secondary | ICD-10-CM | POA: Insufficient documentation

## 2019-08-01 DIAGNOSIS — S0101XA Laceration without foreign body of scalp, initial encounter: Secondary | ICD-10-CM | POA: Diagnosis present

## 2019-08-01 DIAGNOSIS — Z7982 Long term (current) use of aspirin: Secondary | ICD-10-CM | POA: Diagnosis not present

## 2019-08-01 DIAGNOSIS — S0083XA Contusion of other part of head, initial encounter: Secondary | ICD-10-CM

## 2019-08-01 MED ORDER — LIDOCAINE HCL (PF) 1 % IJ SOLN
INTRAMUSCULAR | Status: AC
  Filled 2019-08-01: qty 5

## 2019-08-01 MED ORDER — TETANUS-DIPHTH-ACELL PERTUSSIS 5-2.5-18.5 LF-MCG/0.5 IM SUSP
0.5000 mL | Freq: Once | INTRAMUSCULAR | Status: AC
  Administered 2019-08-01: 04:00:00 0.5 mL via INTRAMUSCULAR
  Filled 2019-08-01: qty 0.5

## 2019-08-01 MED ORDER — LIDOCAINE HCL (PF) 1 % IJ SOLN: 5.0000 mL | Freq: Once | INTRAMUSCULAR | Status: DC

## 2019-08-01 NOTE — ED Triage Notes (Signed)
Patient arrives via EMS from home post- fall at home. Denies LOC but did hit back of head and has lacerations on back of head and lip. Patient alert and oriented, no apparent deficits.

## 2019-08-01 NOTE — ED Notes (Signed)
Dr. Karma Greaser repaired laceration with 4 staples

## 2019-08-01 NOTE — ED Provider Notes (Addendum)
[facility] Emergency Department Provider Note  ____________________________________________   First MD Initiated Contact with Patient 08/01/19 [phone]     (approximate)  I have reviewed the triage vital signs and the nursing notes.   HISTORY  Chief Complaint Fall    HPI Olivia Marsh is a 68 y.o. female with a history of mild dementia but who is managing it well and lives at home by herself.  She presents by EMS for evaluation after a fall.  Reportedly she was walking around and became tripped up on a barstool and fell, striking the back of her head but also apparently somehow striking her mouth.  She denies losing consciousness and was able to call for help.  She has some bleeding to the back of her head and an injury to the middle of her upper lip.  She feels like 1 of her teeth may be a little bit loose.  She denies any other pain.  Specifically she denies neck pain, chest pain, shortness of breath, cough, nausea, vomiting, abdominal pain, pain in her arms or legs, and any visual changes.  She may have a mild headache in the back of her head where she has a laceration.  The onset was acute, symptoms are moderate to severe, nothing in particular makes them better or worse.  She does not take blood thinners.         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    Prior to Admission medications   Medication Sig Start Date End Date Taking? Authorizing Provider  Ascorbic Acid (VITAMIN C PO) Take by mouth daily. Reported on 05/14/2016    [provider]  aspirin 81 MG chewable tablet Chew by mouth daily.    [provider]  atorvastatin (LIPITOR) 20 MG tablet Take 1 tablet (20 mg total) by mouth daily at 6 PM. 09/30/17   [name], MD  Cholecalciferol (VITAMIN D3) 1000 UNITS CAPS Take by mouth daily. Reported on 05/14/2016    [provider]  donepezil (ARICEPT) 10 MG tablet TAKE 1/2 TABLET AT BEDTIME FOR 30 DAYS THEN INCREASE TO 1 TABLET AT BEDTIME 05/08/16   [provider]  escitalopram (LEXAPRO) 5 MG tablet Take 1 tablet (5 mg total) by mouth daily. 09/30/17   [name], MD  memantine (NAMENDA) 5 MG tablet Take 1 tablet (5 mg total) by mouth daily. Patient not taking: Reported on 02/03/2018 09/30/17   [name], MD  Multiple Vitamins-Minerals (CENTRUM SILVER ULTRA WOMENS) TABS Take by mouth daily.    [provider]  Phosphatidylserine-DHA-EPA (VAYACOG PO) Take by mouth. 02/07/17   [provider]  Venlafaxine HCl 225 MG TB24 Take 1 tablet (225 mg total) by mouth daily. 09/30/17   [name], MD    Allergies Patient has no known allergies.  Family History  Problem Relation Age of Onset   Hypertension Mother    Lupus Mother    Dementia Mother    Heart disease Mother 30       aortic valve replacement   Hypertension Father    Heart disease Father 75       CABG/CAD; smoker and drinker   Alcohol abuse Father    Dementia Father    Cancer Father        throat cancer; tobacco abuse.   Arthritis Sister        Rheumatoid Arthritis    Social History Social History   Tobacco Use   Smoking status: Never Smoker    Smokeless tobacco: Never Used  Substance Use Topics   Alcohol use: Not Currently    Comment: occasional   Drug use: No    Review of Systems Constitutional: No fever/chills Eyes: No visual changes. ENT: Possible dental injury.  No sore throat. Cardiovascular: Denies chest pain. Respiratory: Denies shortness of breath. Gastrointestinal: No abdominal pain.  No nausea, no vomiting.  No diarrhea.  No constipation. Genitourinary: Negative for dysuria. Musculoskeletal: Negative for neck pain.  Negative for back pain. Integumentary: Middle upper lip laceration and laceration to the back of her scalp. Neurological: Negative for headaches, focal weakness or numbness.   ____________________________________________   PHYSICAL EXAM:  ED Triage Vitals  Enc Vitals Group     BP 08/01/19 0150 138/87     Pulse Rate 08/01/19 0152 81     Resp 08/01/19 0201 18     Temp 08/01/19 0201 97.6 F (36.4 C)     Temp Source 08/01/19 0201 Oral     SpO2 08/01/19 0152 97 %     Weight 08/01/19 0203 59 kg (130 lb)     Height 08/01/19 0203 1.676 m (5\' 6" )     Head Circumference --      Peak Flow --      Pain Score 08/01/19 0203 2     Pain Loc --      Pain Edu? --      Excl. in [place]? --      Constitutional: Alert and oriented.  No acute distress, able to make jokes with me and is fully oriented at this time in spite of her history of dementia. Eyes: Conjunctivae are normal.  Head: Contusion to the occiput with a 2-cm laceration to the scalp.   Nose: No congestion/rhinnorhea. Mouth/Throat: Mucous membranes are moist.  No visible tongue injury.  She has a very small laceration in the mucosa just to the right of the middle of her upper lip and she has corresponding tenderness to palpation of the front right tooth (#8) that feels just a little bit loose to the touch.  No dental fracture or avulsion. Neck: No stridor.  No meningeal signs.   Cardiovascular: Normal rate, regular rhythm. Good peripheral  circulation. Grossly normal heart sounds. Respiratory: Normal respiratory effort.  No retractions. Gastrointestinal: Soft and nontender. No distention.  Musculoskeletal: No tenderness to palpation of the cervical spine and the patient is able to flex and extend as well as rotate side to side with no pain.  She was brought in by a c-collar but I cleared her clinically upon arrival.  No lower extremity tenderness nor edema. No gross deformities of extremities. Neurologic:  Normal speech and language. No gross focal neurologic deficits are appreciated.  Skin:  Skin is warm and dry.  Lacerations as noted above. Psychiatric: Mood and affect are normal. Speech and behavior are normal.  ____________________________________________   LABS (all labs ordered are listed, but only abnormal results are displayed)  Labs Reviewed - No data to display ____________________________________________  EKG  No indication for EKG ____________________________________________  RADIOLOGY I, Hinda Kehr, personally viewed and evaluated these images (plain radiographs) as part of my medical decision making, as well as reviewing the written report by the radiologist.  ED MD interpretation: No acute abnormalities identified on CT head nor CT cervical spine.  Official radiology report(s): Ct Head Wo Contrast  Result Date: 08/01/2019 CLINICAL DATA:  Fall at home striking head. No loss of consciousness. EXAM: CT HEAD WITHOUT CONTRAST CT CERVICAL SPINE WITHOUT CONTRAST TECHNIQUE: Multidetector CT imaging of the head and cervical spine was performed following the standard protocol without intravenous contrast. Multiplanar CT image reconstructions of the cervical spine were also generated. COMPARISON:  None. FINDINGS: CT HEAD FINDINGS Brain: No intracranial hemorrhage, mass effect, or midline shift. Age related atrophy. Minor chronic small vessel ischemic changes in periventricular white matter. No hydrocephalus. The  basilar cisterns are patent. No evidence of territorial infarct or acute ischemia. No extra-axial or intracranial fluid collection. Vascular: No hyperdense vessel or unexpected calcification. Skull: No fracture or focal lesion. Sinuses/Orbits: Paranasal sinuses and mastoid air cells are clear. The visualized orbits are unremarkable. Other: Small left parietooccipital scalp hematoma. CT CERVICAL SPINE FINDINGS Alignment: Straightening of normal lordosis. No traumatic subluxation. Skull base and vertebrae: No acute fracture. Vertebral body heights are maintained. The dens and skull base are intact. Nonspecific subcentimeter sclerotic focus within C7 vertebral body. Soft tissues and spinal canal: No prevertebral fluid or swelling. No visible canal hematoma. Disc levels: Disc space narrowing and endplate spurring at D34-534, C5-C6, and C6-C7. Scattered facet hypertrophy. Upper chest: No acute findings. Mild carotid calcifications. Other: None. IMPRESSION: 1. Small left parietooccipital scalp hematoma. No acute intracranial abnormality. No skull fracture. 2. Mild degenerative change in the cervical spine without acute fracture or subluxation. Electronically Signed   By: Keith Rake M.D.   On: 08/01/2019 02:43   Ct Cervical Spine Wo Contrast  Result Date: 08/01/2019 CLINICAL DATA:  Fall at home striking head. No loss of consciousness. EXAM: CT HEAD WITHOUT CONTRAST CT CERVICAL SPINE WITHOUT CONTRAST TECHNIQUE: Multidetector CT imaging of the head and cervical spine was performed following the standard protocol without intravenous contrast. Multiplanar CT image reconstructions of the cervical spine were also generated. COMPARISON:  None. FINDINGS: CT HEAD FINDINGS Brain: No intracranial hemorrhage, mass effect, or midline shift. Age related atrophy. Minor chronic small vessel ischemic changes in periventricular white matter. No hydrocephalus. The basilar cisterns are patent. No evidence of territorial infarct or  acute ischemia. No extra-axial or intracranial fluid collection. Vascular: No hyperdense vessel or unexpected calcification. Skull: No fracture or focal lesion. Sinuses/Orbits: Paranasal sinuses and mastoid air cells are clear. The visualized orbits are unremarkable. Other: Small left parietooccipital scalp hematoma. CT CERVICAL SPINE FINDINGS Alignment: Straightening of normal lordosis. No traumatic subluxation. Skull base and vertebrae: No acute fracture. Vertebral body heights are maintained. The dens and skull base are intact. Nonspecific subcentimeter sclerotic focus within C7 vertebral body. Soft tissues and spinal canal: No prevertebral fluid or swelling. No visible canal hematoma. Disc levels: Disc space narrowing and  endplate spurring at D34-534, C5-C6, and C6-C7. Scattered facet hypertrophy. Upper chest: No acute findings. Mild carotid calcifications. Other: None. IMPRESSION: 1. Small left parietooccipital scalp hematoma. No acute intracranial abnormality. No skull fracture. 2. Mild degenerative change in the cervical spine without acute fracture or subluxation. Electronically Signed   By: Keith Rake M.D.   On: 08/01/2019 02:43    ____________________________________________   PROCEDURES   Procedure(s) performed (including Critical Care):  Marland KitchenMarland KitchenLaceration Repair  Date/Time: 08/01/2019 4:06 AM Performed by: Hinda Kehr, MD Authorized by: Hinda Kehr, MD   Consent:    Consent obtained:  Verbal   Consent given by:  Patient   Risks discussed:  Infection, pain, retained foreign body, poor cosmetic result and poor wound healing Anesthesia (see MAR for exact dosages):    Anesthesia method:  Local infiltration   Local anesthetic:  Lidocaine 1% w/o epi Laceration details:    Location:  Scalp   Scalp location:  Occipital   Length (cm):  2 Repair type:    Repair type:  Simple Exploration:    Hemostasis achieved with:  Direct pressure   Wound exploration: entire depth of wound  probed and visualized     Contaminated: no   Treatment:    Area cleansed with:  Saline   Amount of cleaning:  Extensive   Irrigation solution:  Sterile saline   Visualized foreign bodies/material removed: no   Skin repair:    Repair method:  Staples   Number of staples:  4 Approximation:    Approximation:  Close Post-procedure details:    Dressing:  Open (no dressing)   Patient tolerance of procedure:  Tolerated well, no immediate complications     ____________________________________________   INITIAL IMPRESSION / MDM / ASSESSMENT AND PLAN / ED COURSE  As part of my medical decision making, I reviewed the following data within the [place] History obtained from family, Nursing notes reviewed and incorporated, Old chart reviewed and Notes from prior ED visits   Differential diagnosis includes, but is not limited to, mechanical fall, acute intracranial bleeding, laceration, skull fracture, cervical spine injury.  The patient has no evidence of any musculoskeletal injuries in her arms or her legs.  Physical exam is reassuring and she has no cervical spine tenderness to palpation.  Given the history I will obtain a CT of her head and cervical spine and she will require repair of the scalp laceration.  At this time I do not think that she would benefit from sutures to her lip.  I will reassess after the imaging but it will likely be better to have her adhere to a soft diet and have the lip injury heal on its own given the small size and the pain associated with sutures.      Clinical Course as of Jul 31 413  Mon Aug 01, 2019  [id number] Nonemergent head CT and cervical spine CT.  We will perform some wound care and will stapled the scalp laceration.  CT Head Wo Contrast [CF]  0407 The patient tolerated the staples well and is in no distress.  She reports a little bit of generalized muscle soreness but is otherwise feeling well and is in good spirits.  Her son has come to  pick her up.  She understands that she needs to follow-up in about a week to have her staples removed and she let me know that she already has an appointment with her PCP so the timing should work out well.  I gave my usual and customary return precautions.   [CF]    Clinical Course User Index [CF] Hinda Kehr, MD     ____________________________________________  FINAL CLINICAL IMPRESSION(S) / ED DIAGNOSES  Final diagnoses:  Fall, initial encounter  Contusion of other part of head, initial encounter  Scalp laceration, initial encounter  Lip laceration, initial encounter  Dental injury, initial encounter     MEDICATIONS GIVEN DURING THIS VISIT:  Medications  lidocaine (PF) (XYLOCAINE) 1 % injection 5 mL (has no administration in time range)  Tdap (BOOSTRIX) injection 0.5 mL (has no administration in time range)     ED Discharge Orders    None      *Please note:  Olivia Marsh was evaluated in Emergency Department on 08/01/2019 for the symptoms described in the history of present illness. She was evaluated in the context of the global COVID-19 pandemic, which necessitated consideration that the patient might be at risk for infection with the SARS-CoV-2 virus that causes COVID-19. Institutional protocols and algorithms that pertain to the evaluation of patients at risk for COVID-19 are in a state of rapid change based on information released by regulatory bodies including the CDC and federal and state organizations. These policies and algorithms were followed during the patient's care in the ED.  Some ED evaluations and interventions may be delayed as a result of limited staffing during the pandemic.*  Note:  This document was prepared using Dragon voice recognition software and may include unintentional dictation errors.   Hinda Kehr, MD 08/01/19 [id number]    Hinda Kehr, MD 08/01/19 [phone]

## 2019-08-01 NOTE — ED Notes (Signed)
Patient transported to CT 

## 2019-08-01 NOTE — Discharge Instructions (Addendum)
You have been seen in the Emergency Department (ED) today for a laceration (cut).  Please keep the cut clean but do not submerge it in the water.  It has been repaired with staples that will need to be removed in about 7 days. Please follow up with your doctor, an urgent care, or return to the ED for suture removal.    As we discussed, the laceration to your lip is small enough that I do not think it would benefit from sutures, but you should try to stick with a soft food diet and allow it to heal, at least for the next week to 10 days.  Additionally you should stick with soft foods because it does seem that you have not to lose the front right tooth and it is important that you follow-up with a dentist at the next available opportunity to see what can be done to anchor the tooth or try to salvage it.  Please take Tylenol (acetaminophen) or Motrin (ibuprofen) as needed for discomfort as written on the box.   Please follow up with your doctor as soon as possible regarding today's emergent visit.   Return to the ED or call your doctor if you notice any signs of infection such as fever, increased pain, increased redness, pus, or other symptoms that concern you.

## 2019-08-01 NOTE — ED Notes (Signed)
Patient's hair washed, removed most of dried blood with sterile water. Revealed laceration approx 1 inch long

## 2020-01-27 ENCOUNTER — Other Ambulatory Visit: Payer: Self-pay | Admitting: Family Medicine

## 2020-01-27 DIAGNOSIS — Z1231 Encounter for screening mammogram for malignant neoplasm of breast: Secondary | ICD-10-CM

## 2020-02-09 ENCOUNTER — Ambulatory Visit
Admission: RE | Admit: 2020-02-09 | Discharge: 2020-02-09 | Disposition: A | Discharge status: Home / Self Care | Payer: Medicare PPO | Source: Ambulatory Visit | Attending: Family Medicine | Admitting: Family Medicine

## 2020-02-09 DIAGNOSIS — Z1231 Encounter for screening mammogram for malignant neoplasm of breast: Secondary | ICD-10-CM | POA: Diagnosis present

## 2020-04-16 ENCOUNTER — Ambulatory Visit: Payer: Medicare Other | Admitting: Dermatology

## 2021-03-29 ENCOUNTER — Other Ambulatory Visit: Payer: Self-pay | Admitting: Family Medicine

## 2021-03-29 DIAGNOSIS — Z1231 Encounter for screening mammogram for malignant neoplasm of breast: Secondary | ICD-10-CM

## 2021-04-09 ENCOUNTER — Ambulatory Visit
Admission: RE | Admit: 2021-04-09 | Discharge: 2021-04-09 | Disposition: A | Discharge status: Home / Self Care | Payer: Medicare PPO | Source: Ambulatory Visit | Attending: Family Medicine | Admitting: Family Medicine

## 2021-04-09 ENCOUNTER — Other Ambulatory Visit: Payer: Self-pay

## 2021-04-09 DIAGNOSIS — Z1231 Encounter for screening mammogram for malignant neoplasm of breast: Secondary | ICD-10-CM | POA: Diagnosis not present

## 2022-05-29 ENCOUNTER — Other Ambulatory Visit: Payer: Self-pay | Admitting: Family Medicine

## 2022-05-29 DIAGNOSIS — Z1231 Encounter for screening mammogram for malignant neoplasm of breast: Secondary | ICD-10-CM

## 2022-06-11 ENCOUNTER — Ambulatory Visit
Admission: RE | Admit: 2022-06-11 | Discharge: 2022-06-11 | Disposition: A | Discharge status: Home / Self Care | Payer: Medicare PPO | Source: Ambulatory Visit | Attending: Family Medicine | Admitting: Family Medicine

## 2022-06-11 DIAGNOSIS — Z1231 Encounter for screening mammogram for malignant neoplasm of breast: Secondary | ICD-10-CM | POA: Diagnosis present

## 2024-03-03 DEATH — deceased
